# Patient Record
Sex: Male | Born: 1960 | Race: White | Hispanic: No | Smoking: Former smoker
Health system: Southern US, Community
[De-identification: ages and names within clinical notes are randomized; demographics above are authoritative.]

## PROBLEM LIST (undated history)

## (undated) DIAGNOSIS — K219 Gastro-esophageal reflux disease without esophagitis: Secondary | ICD-10-CM

## (undated) DIAGNOSIS — M1990 Unspecified osteoarthritis, unspecified site: Secondary | ICD-10-CM

## (undated) DIAGNOSIS — G4730 Sleep apnea, unspecified: Secondary | ICD-10-CM

## (undated) DIAGNOSIS — Z87438 Personal history of other diseases of male genital organs: Secondary | ICD-10-CM

## (undated) DIAGNOSIS — I10 Essential (primary) hypertension: Secondary | ICD-10-CM

## (undated) DIAGNOSIS — E785 Hyperlipidemia, unspecified: Secondary | ICD-10-CM

## (undated) HISTORY — DX: Personal history of other diseases of male genital organs: Z87.438

## (undated) HISTORY — DX: Gastro-esophageal reflux disease without esophagitis: K21.9

## (undated) HISTORY — DX: Sleep apnea, unspecified: G47.30

## (undated) HISTORY — DX: Hyperlipidemia, unspecified: E78.5

## (undated) HISTORY — DX: Essential (primary) hypertension: I10

## (undated) HISTORY — PX: EYE SURGERY: SHX253

## (undated) HISTORY — PX: HERNIA REPAIR: SHX51

## (undated) HISTORY — DX: Unspecified osteoarthritis, unspecified site: M19.90

---

## 2005-02-07 ENCOUNTER — Encounter: Admission: RE | Admit: 2005-02-07 | Discharge: 2005-02-07 | Payer: Self-pay | Admitting: Orthopedic Surgery

## 2007-01-08 ENCOUNTER — Ambulatory Visit: Payer: Self-pay | Admitting: Gastroenterology

## 2007-01-08 LAB — CONVERTED CEMR LAB
Basophils Absolute: 0.1 10*3/uL (ref 0.0–0.1)
Basophils Relative: 1 % (ref 0.0–1.0)
Eosinophils Absolute: 0.2 10*3/uL (ref 0.0–0.6)
Eosinophils Relative: 2.1 % (ref 0.0–5.0)
Ferritin: 126.4 ng/mL (ref 22.0–322.0)
Folate: 6.5 ng/mL
HCT: 40.5 % (ref 39.0–52.0)
Hemoglobin: 14.3 g/dL (ref 13.0–17.0)
Iron: 151 ug/dL (ref 42–165)
Lymphocytes Relative: 31.7 % (ref 12.0–46.0)
MCHC: 35.2 g/dL (ref 30.0–36.0)
MCV: 90.7 fL (ref 78.0–100.0)
Monocytes Absolute: 0.6 10*3/uL (ref 0.2–0.7)
Monocytes Relative: 8.4 % (ref 3.0–11.0)
Neutro Abs: 4.2 10*3/uL (ref 1.4–7.7)
Neutrophils Relative %: 56.8 % (ref 43.0–77.0)
Platelets: 303 10*3/uL (ref 150–400)
RBC: 4.47 M/uL (ref 4.22–5.81)
RDW: 11.2 % — ABNORMAL LOW (ref 11.5–14.6)
Saturation Ratios: 51.8 % — ABNORMAL HIGH (ref 20.0–50.0)
Transferrin: 208.4 mg/dL — ABNORMAL LOW (ref >=212.0)
Vitamin B-12: 195 pg/mL — ABNORMAL LOW (ref 211–911)
WBC: 7.5 10*3/uL (ref 4.5–10.5)

## 2007-01-18 ENCOUNTER — Ambulatory Visit: Payer: Self-pay | Admitting: Gastroenterology

## 2007-01-18 LAB — CONVERTED CEMR LAB
ALT: 17 units/L (ref 0–53)
AST: 17 units/L (ref 0–37)
Albumin: 3.5 g/dL (ref 3.5–5.2)
Alkaline Phosphatase: 63 units/L (ref 39–117)
Bilirubin, Direct: 0.1 mg/dL (ref 0.0–0.3)
Total Bilirubin: 0.9 mg/dL (ref 0.3–1.2)
Total Protein: 6.1 g/dL (ref 6.0–8.3)

## 2007-01-27 ENCOUNTER — Ambulatory Visit: Payer: Self-pay | Admitting: Gastroenterology

## 2007-02-03 ENCOUNTER — Ambulatory Visit: Payer: Self-pay | Admitting: Gastroenterology

## 2007-06-18 DIAGNOSIS — K921 Melena: Secondary | ICD-10-CM

## 2007-06-18 DIAGNOSIS — Z87891 Personal history of nicotine dependence: Secondary | ICD-10-CM

## 2007-06-18 DIAGNOSIS — K219 Gastro-esophageal reflux disease without esophagitis: Secondary | ICD-10-CM

## 2007-06-18 DIAGNOSIS — F1021 Alcohol dependence, in remission: Secondary | ICD-10-CM

## 2007-09-02 ENCOUNTER — Ambulatory Visit (HOSPITAL_COMMUNITY): Admission: RE | Admit: 2007-09-02 | Discharge: 2007-09-02 | Payer: Self-pay | Admitting: Surgery

## 2008-01-18 ENCOUNTER — Ambulatory Visit: Payer: Self-pay | Admitting: Gastroenterology

## 2008-01-18 DIAGNOSIS — D649 Anemia, unspecified: Secondary | ICD-10-CM

## 2008-01-18 DIAGNOSIS — E538 Deficiency of other specified B group vitamins: Secondary | ICD-10-CM

## 2008-01-18 LAB — CONVERTED CEMR LAB: Ferritin: 131.1 ng/mL (ref 22.0–322.0)

## 2008-02-07 ENCOUNTER — Telehealth: Payer: Self-pay | Admitting: Gastroenterology

## 2008-02-09 ENCOUNTER — Ambulatory Visit: Payer: Self-pay | Admitting: Gastroenterology

## 2008-02-09 DIAGNOSIS — K2980 Duodenitis without bleeding: Secondary | ICD-10-CM | POA: Insufficient documentation

## 2008-02-09 HISTORY — PX: COLONOSCOPY: SHX174

## 2008-02-09 LAB — CONVERTED CEMR LAB: UREASE: NEGATIVE

## 2008-02-11 ENCOUNTER — Encounter (INDEPENDENT_AMBULATORY_CARE_PROVIDER_SITE_OTHER): Payer: Self-pay | Admitting: *Deleted

## 2008-02-11 ENCOUNTER — Telehealth: Payer: Self-pay | Admitting: Gastroenterology

## 2008-08-08 ENCOUNTER — Encounter: Admission: RE | Admit: 2008-08-08 | Discharge: 2008-09-19 | Payer: Self-pay | Admitting: Family Medicine

## 2009-11-20 ENCOUNTER — Encounter: Admission: RE | Admit: 2009-11-20 | Discharge: 2009-11-20 | Payer: Self-pay | Admitting: Family Medicine

## 2010-04-02 ENCOUNTER — Encounter
Admission: RE | Admit: 2010-04-02 | Discharge: 2010-04-02 | Payer: Self-pay | Source: Home / Self Care | Attending: Nurse Practitioner | Admitting: Nurse Practitioner

## 2010-05-11 ENCOUNTER — Encounter: Payer: Self-pay | Admitting: Orthopedic Surgery

## 2010-05-12 ENCOUNTER — Encounter: Payer: Self-pay | Admitting: Family Medicine

## 2010-09-03 NOTE — Op Note (Signed)
Alec Gutierrez, Alec Gutierrez                ACCOUNT NO.:  [id number]   MEDICAL RECORD NO.:  [id number]          PATIENT TYPE:  AMB   LOCATION:  DAY                          FACILITY:  [facility]   PHYSICIAN:  Ardeth Sportsman, MD     DATE OF BIRTH:  [birth year]   DATE OF PROCEDURE:  DATE OF DISCHARGE:                               OPERATIVE REPORT   PRIMARY CARE PHYSICIAN:  Dr. Lindaann Pascal.   UROLOGIST:  Dr. Bjorn Pippin.   SURGEON:  Ardeth Sportsman, MD   PREOPERATIVE DIAGNOSES:  1. Left inguinal hernia.  2. Umbilical hernia.   POSTOPERATIVE DIAGNOSES:  1. Left inguinal hernia.  2. Umbilical hernia.   PROCEDURE:  1. Laparoscopic left inguinal hernia repair.  2. Umbilical hernia underlay and repair with mesh.   ANESTHESIA:  1. General anesthesia.  2. Local anesthetic in a field block around the port sites.  3. Left ilioinguinal/genitofemoral/cord nerve blocks.   SPECIMEN:  None.   DRAINS:  None.   ESTIMATED BLOOD LOSS:  Less than 50 mL.   COMPLICATIONS:  None apparent.   INDICATIONS:  Alec Gutierrez is a 50 year old male who was found to have an  incidental left inguinal hernia that became increasingly symptomatic.  He also has an umbilical hernia.  Based on increasing discomfort with  this, pathophysiology of inguinal herniations with its risks of  incarceration, strangulation and developing worsening pain was  discussed.  Options discussed and recommendations made for laparoscopic  preperitoneal exploration with left inguinal hernia repair.  There was  no evidence of right hernia again on exam today and so I did not  recommend evaluation there.  I also recommended umbilical hernia repair  with mesh.  The risks, benefits and alternatives were  discussed in  detail, questions answered and he agreed to proceed.   OPERATIVE FINDINGS:  He had a left indirect and direct inguinal hernia  and (pantaloon type).  He had a 12 mm umbilical hernia through the  stalk.   DESCRIPTION OF PROCEDURE:   Informed consent was confirmed.  The patient  voided just prior to going to the operating room.  He has been on some  oxybutynin chronically.  He underwent general anesthesia without any  difficulty.  He had sequential compression devices active during the  entire case.  He received IV antibiotics just prior to surgery.  He was  positioned supine, both arms tucked.  His abdomen clipped, prepped and  draped in a sterile fashion.   Entry into the preperitoneal space was done through an infraumbilical  curvilinear incision.  A nick was made just right of the rectus  abdominis infraumbilically and a 10 mm Hasson port was passed posterior  to the right rectus abdominal muscle.  Capnopreperitoneum to 15 mmHg  provided good anterior abdominal wall insufflation.  Camera dissection  was used to help free the peritoneum off the anterior abdominal wall and  bilateral lower quadrants.  Enough working space was created such that a  5-mm port could be placed in the left mid abdomen and the right mid  abdomen.  In doing dissection,  there was a tear in the peritoneum in the  left mid abdomen and this was controlled using a 3-0 Vicryl figure-of-  eight stitch using laparoscopic intracorporeal suturing.   The peritoneum could be seen crawling up with the cord structures in the  left lower abdomen.  The hernia sac was peeled off the cord structures  and peeled back as proximally as possible.  He had a moderate cord  lipoma.  In freeing this off, there was some oozing of blood and this is  where most of his bleeding occurred.  Pressure was held and irrigation  was done and hemostasis was good body of the end of the case.  A window  was made between his bladder and left anterior pelvic sidewall to get  down to the level of the obturator foramen.  The peritoneum was peeled  off out laterally as well. Dissection revealed direct and indirect  inguinal hernias and these were brought back.   A 15 x 15 cm ultra  light weight polypropylene (Ultrapro) mesh was cut to  a half-skull shape.  It was positioned such that the medial and inferior  flap rested down on the true pelvis, around the obturator foramen  between the lateral bladder and the anterolateral pelvic wall.  The  midline bladder raphe structures and right side of the bladder were held  intact.  The mesh laid well posterior, laterally, superiorly and  medially such that there was a least 3 inches of circumferential  coverage around both hernia defects.  The hernia sac lead points were  grasped and elevated cephalad as the capnopreperitoneum was evacuated.  Ports were removed.   Attention was turned to the umbilical hernia.  Dissection was done  around the umbilical stalk and it was freed off.  No obvious defect was  noted.  I tried to do a gentle finger sweep dissection around the  preperitoneal plane but there was a breach in this.  Therefore, I did  sweep and found no adhesions intra-abdominally.  A  6.5 cm Proceed  umbilical hernia patch was used and placed in the peritoneal cavity with  the smooth nonadherent side facing the omentum and small bowel.  The  tails were pulled up and the mesh laid well flat.  It was secured in an  underlay repair using #1 Novofil horizontal mattress stitches x4.  The  excess tails were trimmed down and tucked back into the preperitoneal  plane. The hernia defect was closed primarily using #1 Novofil figure-of-  eight stitches x2.  The umbilical stalk was tacked down to the fascia  using a zero Vicryl stitch.  The skin was closed using 4-0 Monocryl  stitch on all port sites and infraumbilical hernia site.  Sterile  dressings were applied.  The patient was extubated and sent to the  recovery room in stable addition.   There was no family to discuss with him and he did not want me to call  anyone for him. I had discussed postoperative care with him just prior  to surgery and instructions have been given as  well. We will see him in  about 2 weeks.      Ardeth Sportsman, MD  Electronically Signed     SCG/MEDQ  D:  09/02/2007  T:  09/02/2007  Job:  [id number]   cc:   Lindaann Pascal, MD   Excell Seltzer. Annabell Howells, M.D.  Fax: [phone]

## 2010-09-03 NOTE — Assessment & Plan Note (Signed)
[facility]                         GASTROENTEROLOGY OFFICE NOTE   Alec Gutierrez, Alec Gutierrez                       MRN:          [id number]  DATE:01/08/2007                            DOB:          1960-12-26    Alec Gutierrez is a 50 year old white male referred by Lindaann Pascal, PA for  evaluation of guaiac positive stools and intermittent rectal bleeding  over the last year.   HISTORY OF PRESENT ILLNESS:  Alec Gutierrez described regular stools without  constipation, abdominal pain or rectal pain.  He has had bright red  blood intermittently for the last year.  He recently had a guaiac  positive stool.  He also gives a history of chronic gastroesophageal  reflux disease over the last 3 years, is on maintenance Prilosec  therapy.  He had an episode of melena, apparently a year ago that lasted  one week but resolved on its own.   RECENT LABORATORY DATA:  [facility] showed a  normal metabolic profile and liver function tests and PSA and thyroid  function test.  Chest x-ray was normal.  I do not see CBC results.   Alec Gutierrez denies abuse of NSAIDs or aspirin products.   His only medication is Prilosec.   He has never had previous x-rays or endoscopic procedures.  His appetite  is good and his weight is stable.  He denies any specific food  intolerances.   PAST MEDICAL HISTORY:  Otherwise noncontributory.   FAMILY HISTORY:  Noncontributory.   SOCIAL HISTORY:  The patient is divorced and lives by himself.  He has a  12th grade education and works in [employer].  He smokes a 1/2 pack  of cigarettes per day and drinks 6-8 beers a day.  He does feel like he  has a problem with alcohol abuse and has had previous DWIs.  He has  contemplated discontinuing alcohol use in the past.  He has never had  known pancreatitis or hepatitis.   REVIEW OF SYSTEMS:  Otherwise noncontributory without any current  cardiovascular, pulmonary, neurologic, or  genitourinary problems.   PHYSICAL EXAMINATION:  GENERAL:  He is a healthy-appearing white male  appearing his stated age in no acute distress.  VITAL SIGNS:  He is 5 feet, 10 inches tall and weighs 160 pounds.  Blood  pressure 114/54 and pulse was 100 and regular.  I could not appreciate  stigmata of chronic liver disease or thyromegaly.  CHEST:  Entirely clear to percussion/auscultation.  HEART:  Regular rhythm without murmurs, gallops, or rubs.  There was no  hepatosplenomegaly, abdominal masses, or tenderness.  ABDOMEN:  Bowel sounds were normal.  EXTREMITIES:  Unremarkable.  MENTAL STATUS:  Clear.  RECTAL:  Inspection of his rectum did show a small posterior external  hemorrhoid but no fissures of fistulae.  Rectal exam was deferred.   ASSESSMENT:  1. Intermittent rectal bleeding - rule out colonic polyposis.  2. Chronic gastroesophageal reflux disease - rule out Barrett's      mucosa.  3. History of chronic ethanol abuse without known liver or pancreatic  disease.  4. History of chronic cigarette abuse with normal chest x-ray.   RECOMMENDATIONS:  1. Check CBC and iron studies.  2. Outpatient endoscopy and colonoscopy at his convenience.  3. Continue other medications per Dr. Jacqulyn Bath.     Vania Rea. Jarold Motto, MD, Caleen Essex, FAGA  Electronically Signed    DRP/MedQ  DD: 01/08/2007  DT: 01/08/2007  Job #: [id number]   cc:   Lindaann Pascal, PA

## 2010-10-22 ENCOUNTER — Ambulatory Visit
Admission: RE | Admit: 2010-10-22 | Discharge: 2010-10-22 | Disposition: A | Discharge status: Home / Self Care | Payer: 59 | Source: Ambulatory Visit | Attending: Family Medicine | Admitting: Family Medicine

## 2010-10-22 ENCOUNTER — Other Ambulatory Visit: Payer: Self-pay | Admitting: Family Medicine

## 2010-10-22 DIAGNOSIS — M7989 Other specified soft tissue disorders: Secondary | ICD-10-CM

## 2012-02-14 LAB — CBC AND DIFFERENTIAL
HCT: 15 % — AB (ref 41–53)
Platelets: 285 10*3/uL (ref 150–399)
WBC: 9 10^3/mL

## 2012-02-14 LAB — HEPATIC FUNCTION PANEL
ALT: 53 U/L — AB (ref 10–40)
AST: 34 U/L (ref 14–40)
Alkaline Phosphatase: 69 U/L (ref 25–125)
Bilirubin, Total: 0.5 mg/dL

## 2012-02-14 LAB — LIPID PANEL
Cholesterol: 263 mg/dL — AB (ref 0–200)
HDL: 57 mg/dL (ref 35–70)
LDL Cholesterol: 188 mg/dL

## 2012-02-14 LAB — BASIC METABOLIC PANEL
BUN: 14 mg/dL (ref 4–21)
Creatinine: 0.9 mg/dL (ref 0.6–1.3)
Glucose: 89 mg/dL
Sodium: 140 mmol/L (ref 137–147)

## 2012-02-14 LAB — TSH: TSH: 2.96 u[IU]/mL (ref 0.41–5.90)

## 2012-07-06 ENCOUNTER — Ambulatory Visit: Payer: Self-pay | Admitting: General Practice

## 2012-08-18 ENCOUNTER — Telehealth: Payer: Self-pay | Admitting: Nurse Practitioner

## 2012-08-18 NOTE — Telephone Encounter (Signed)
appt made

## 2012-08-19 ENCOUNTER — Ambulatory Visit (INDEPENDENT_AMBULATORY_CARE_PROVIDER_SITE_OTHER): Payer: Managed Care, Other (non HMO) | Admitting: General Practice

## 2012-08-19 ENCOUNTER — Encounter: Payer: Self-pay | Admitting: General Practice

## 2012-08-19 VITALS — BP 132/72 | HR 94 | Temp 98.4°F | Ht 69.0 in | Wt 192.0 lb

## 2012-08-19 DIAGNOSIS — R220 Localized swelling, mass and lump, head: Secondary | ICD-10-CM

## 2012-08-19 DIAGNOSIS — R221 Localized swelling, mass and lump, neck: Secondary | ICD-10-CM

## 2012-08-19 DIAGNOSIS — J322 Chronic ethmoidal sinusitis: Secondary | ICD-10-CM

## 2012-08-19 MED ORDER — AMOXICILLIN 500 MG PO CAPS: 500.0000 mg | ORAL_CAPSULE | Freq: Two times a day (BID) | ORAL | Status: DC

## 2012-08-19 NOTE — Progress Notes (Addendum)
  Subjective:    Patient ID: Alec Gutierrez, male    DOB: [birth year], 52 y.o.   MRN: [id number]  HPI Presents today with sinus pressure (forehead area and behind eyes) and headache. Reports having a sinus infection a three months ago and feels like it never resolved. OTC sinus and congestion medications taken with minimal relief. Reports having lump just below right ear and neck area.     Review of Systems  Constitutional: Negative for chills.  HENT: Negative for congestion, sneezing and postnasal drip.   Respiratory: Negative for chest tightness and shortness of breath.   Cardiovascular: Negative for chest pain.  Genitourinary: Negative for difficulty urinating.  Musculoskeletal: Negative for myalgias.  Skin: Negative.   Psychiatric/Behavioral: Negative.        Objective:   Physical Exam  Constitutional: He is oriented to person, place, and time. He appears well-developed and well-nourished.  HENT:  Right Ear: External ear normal.  Left Ear: External ear normal.  Nose: Right sinus exhibits frontal sinus tenderness. Right sinus exhibits no maxillary sinus tenderness. Left sinus exhibits frontal sinus tenderness. Left sinus exhibits no maxillary sinus tenderness.  Mouth/Throat: Oropharynx is clear and moist.  Firm 1 inch x 1/2 inch mass noted to right upper neck.   Cardiovascular: Normal rate, regular rhythm and normal heart sounds.   No murmur heard. Pulmonary/Chest: Effort normal and breath sounds normal. No respiratory distress. He exhibits no tenderness.  Neurological: He is alert and oriented to person, place, and time.  Skin: Skin is warm and dry. No rash noted. No erythema.  Psychiatric: He has a normal mood and affect.          Assessment & Plan:  Take antibiotics as prescribed, complete course even if feeling better Ultrasound of right neck to be scheduled RTO if symptoms worsen Patient verbalized understanding Coralie Keens, FNP-C

## 2012-08-19 NOTE — Progress Notes (Signed)
Appt scheduled for US of the neck at [facility] on 08/23/13.  Patient aware.

## 2012-08-19 NOTE — Patient Instructions (Signed)

## 2012-08-23 ENCOUNTER — Ambulatory Visit (HOSPITAL_COMMUNITY)
Admission: RE | Admit: 2012-08-23 | Discharge: 2012-08-23 | Disposition: A | Discharge status: Home / Self Care | Payer: Managed Care, Other (non HMO) | Source: Ambulatory Visit | Attending: General Practice | Admitting: General Practice

## 2012-08-23 DIAGNOSIS — R221 Localized swelling, mass and lump, neck: Secondary | ICD-10-CM | POA: Insufficient documentation

## 2012-08-23 DIAGNOSIS — J322 Chronic ethmoidal sinusitis: Secondary | ICD-10-CM

## 2012-08-23 DIAGNOSIS — R220 Localized swelling, mass and lump, head: Secondary | ICD-10-CM | POA: Insufficient documentation

## 2012-08-25 ENCOUNTER — Telehealth: Payer: Self-pay | Admitting: Family Medicine

## 2012-08-25 NOTE — Telephone Encounter (Signed)
Patient called and said he would like someone to call him about his procedure results, Monday/Aug 23, 2012

## 2012-08-25 NOTE — Telephone Encounter (Signed)
The ultrasound of the neck indicates this is most likely a reactive lymph node not a neoplastic node. This means that is probably responding to some infection viral or bacterial. Ask if he has any additional questions. My consider trying an antibiotic for 10 days and then following up on checking the lymph node. If it doesn't go away we may have to consider biopsy.

## 2012-08-25 NOTE — Telephone Encounter (Signed)
Please look at his ultrasound he had done Monday.

## 2012-08-25 NOTE — Telephone Encounter (Signed)
Mae, I think you ordered this on this pt. Please review

## 2012-08-26 ENCOUNTER — Telehealth: Payer: Self-pay | Admitting: General Practice

## 2012-08-26 NOTE — Telephone Encounter (Signed)
Discussed ultrasound results with patient, he continues to take antibiotics, and will schedule appointment in 2 weeks for recheck. Patient verbalized understanding.

## 2013-04-20 ENCOUNTER — Encounter: Payer: Self-pay | Admitting: Family Medicine

## 2013-04-20 ENCOUNTER — Ambulatory Visit (INDEPENDENT_AMBULATORY_CARE_PROVIDER_SITE_OTHER): Payer: Managed Care, Other (non HMO) | Admitting: Family Medicine

## 2013-04-20 VITALS — BP 125/77 | HR 89 | Temp 98.2°F | Ht 68.0 in | Wt 189.0 lb

## 2013-04-20 DIAGNOSIS — Z23 Encounter for immunization: Secondary | ICD-10-CM

## 2013-04-20 DIAGNOSIS — M129 Arthropathy, unspecified: Secondary | ICD-10-CM

## 2013-04-20 DIAGNOSIS — M1990 Unspecified osteoarthritis, unspecified site: Secondary | ICD-10-CM

## 2013-04-20 DIAGNOSIS — L818 Other specified disorders of pigmentation: Secondary | ICD-10-CM

## 2013-04-20 DIAGNOSIS — L819 Disorder of pigmentation, unspecified: Secondary | ICD-10-CM

## 2013-04-20 MED ORDER — MELOXICAM 15 MG PO TABS: 15.0000 mg | ORAL_TABLET | Freq: Every day | ORAL | Status: DC

## 2013-04-20 NOTE — Progress Notes (Signed)
   Subjective:    Patient ID: Alec Gutierrez, male    DOB: 08-Aug-1960, 52 y.o.   MRN: [id number]  HPI  This 52 y.o. male presents for evaluation of arthritis and back pain.  He was taking meloxicam and quit Because he was told by his insurance company that he shouldn't take it.  He also quit taking pravachol Because he states he was told not to take this by someone from [employer] company who called him.  He has a tattoo on his left shoulder he wants removed and wants a referral to see a dermatologist. He is due for CPE and CPE labs. He request flu shot.  Review of Systems No chest pain, SOB, HA, dizziness, vision change, N/V, diarrhea, constipation, dysuria, urinary urgency or frequency, myalgias, arthralgias or rash.     Objective:   Physical Exam  Vital signs noted  Well developed well nourished male.  HEENT - Head atraumatic Normocephalic                Eyes - PERRLA, Conjuctiva - clear Sclera- Clear EOMI                Ears - EAC's Wnl TM's Wnl Gross Hearing WNL                Nose - Nares patent                 Throat - oropharanx wnl Respiratory - Lungs CTA bilateral Cardiac - RRR S1 and S2 without murmur GI - Abdomen soft Nontender and bowel sounds active x 4 Extremities - No edema. Neuro - Grossly intact.      Assessment & Plan:  Arthritis - Plan: meloxicam (MOBIC) 15 MG tablet  Tattoo of skin - Plan: Ambulatory referral to Dermatology  Need for prophylactic vaccination and inoculation against influenza  Discussed getting a CPE and willl follow up in next year.    Deatra Canter FNP

## 2013-04-20 NOTE — Patient Instructions (Signed)

## 2013-04-22 ENCOUNTER — Telehealth: Payer: Self-pay | Admitting: Family Medicine

## 2013-04-25 ENCOUNTER — Telehealth: Payer: Self-pay | Admitting: Family Medicine

## 2013-04-26 ENCOUNTER — Other Ambulatory Visit: Payer: Self-pay

## 2013-04-26 DIAGNOSIS — M1990 Unspecified osteoarthritis, unspecified site: Secondary | ICD-10-CM

## 2013-04-26 MED ORDER — MELOXICAM 15 MG PO TABS
15.0000 mg | ORAL_TABLET | Freq: Every day | ORAL | Status: DC
Start: 2013-04-26 — End: 2013-04-28

## 2013-04-26 NOTE — Telephone Encounter (Signed)
Order for mobic was sent to pharmacy today.

## 2013-04-28 ENCOUNTER — Other Ambulatory Visit: Payer: Self-pay | Admitting: Family Medicine

## 2013-04-28 DIAGNOSIS — M1990 Unspecified osteoarthritis, unspecified site: Secondary | ICD-10-CM

## 2013-04-28 MED ORDER — MELOXICAM 15 MG PO TABS: 15.0000 mg | ORAL_TABLET | Freq: Every day | ORAL | Status: DC

## 2013-05-25 ENCOUNTER — Encounter: Payer: Self-pay | Admitting: Nurse Practitioner

## 2013-05-25 ENCOUNTER — Ambulatory Visit (INDEPENDENT_AMBULATORY_CARE_PROVIDER_SITE_OTHER): Payer: Managed Care, Other (non HMO) | Admitting: Nurse Practitioner

## 2013-05-25 VITALS — BP 148/94 | HR 108 | Temp 101.0°F | Ht 68.0 in | Wt 193.0 lb

## 2013-05-25 DIAGNOSIS — J029 Acute pharyngitis, unspecified: Secondary | ICD-10-CM

## 2013-05-25 DIAGNOSIS — R05 Cough: Secondary | ICD-10-CM

## 2013-05-25 DIAGNOSIS — R059 Cough, unspecified: Secondary | ICD-10-CM

## 2013-05-25 DIAGNOSIS — R509 Fever, unspecified: Secondary | ICD-10-CM

## 2013-05-25 DIAGNOSIS — J111 Influenza due to unidentified influenza virus with other respiratory manifestations: Secondary | ICD-10-CM

## 2013-05-25 LAB — POCT RAPID STREP A (OFFICE): Rapid Strep A Screen: NEGATIVE

## 2013-05-25 LAB — POCT INFLUENZA A/B
Influenza A, POC: NEGATIVE
Influenza B, POC: NEGATIVE

## 2013-05-25 MED ORDER — OSELTAMIVIR PHOSPHATE 75 MG PO CAPS: 75.0000 mg | ORAL_CAPSULE | Freq: Two times a day (BID) | ORAL | Status: DC

## 2013-05-25 NOTE — Patient Instructions (Signed)

## 2013-05-25 NOTE — Progress Notes (Signed)
   Subjective:    Patient ID: Alec Gutierrez, male    DOB: [birth year], 53 y.o.   MRN: [id number]  HPI Patient in today with c/o cough, congestion,sorethroat and nausea.- Started 3 days ago.    Review of Systems  Constitutional: Positive for fever, chills and diaphoresis.  HENT: Positive for congestion, rhinorrhea and sore throat. Negative for trouble swallowing and voice change.   Respiratory: Positive for cough.   Gastrointestinal: Positive for nausea and vomiting (X2 last night).  Neurological: Positive for headaches.       Objective:   Physical Exam  Constitutional: He appears well-developed and well-nourished. He appears distressed.  HENT:  Right Ear: Hearing, tympanic membrane, external ear and ear canal normal.  Left Ear: Hearing, tympanic membrane, external ear and ear canal normal.  Nose: Mucosal edema and rhinorrhea present. Right sinus exhibits no maxillary sinus tenderness and no frontal sinus tenderness. Left sinus exhibits no maxillary sinus tenderness and no frontal sinus tenderness.  Mouth/Throat: Uvula is midline, oropharynx is clear and moist and mucous membranes are normal.  Eyes: Conjunctivae are normal. Pupils are equal, round, and reactive to light.  Neck: Normal range of motion. Neck supple.  Cardiovascular: Normal rate, regular rhythm and normal heart sounds.   Pulmonary/Chest: Effort normal and breath sounds normal. No respiratory distress. He has no wheezes. He has no rales.  Dry cough  Psychiatric: He has a normal mood and affect. His behavior is normal. Judgment and thought content normal.     BP 148/94  Pulse 108  Temp(Src) 101 F (38.3 C) (Oral)  Ht 5\' 8"  (1.727 m)  Wt 193 lb (87.544 kg)  BMI 29.35 kg/m2 Results for orders placed in visit on 05/25/13  POCT INFLUENZA A/B      Result Value Range   Influenza A, POC Negative     Influenza B, POC Negative    POCT RAPID STREP A (OFFICE)      Result Value Range   Rapid Strep A Screen Negative   Negative        Assessment & Plan:   1. Fever   2. Cough   3. Sore throat   4. Influenza    Meds ordered this encounter  Medications  . oseltamivir (TAMIFLU) 75 MG capsule    Sig: Take 1 capsule (75 mg total) by mouth 2 (two) times daily.    Dispense:  10 capsule    Refill:  0    Order Specific Question:  Supervising Provider    Answer:  Chipper Herb [[id number]]    1. Take meds as prescribed 2. Use a cool mist humidifier especially during the winter months and when heat has been humid. 3. Use saline nose sprays frequently 4. Saline irrigations of the nose can be very helpful if done frequently.  * 4X daily for 1 week*  * Use of a nettie pot can be helpful with this. Follow directions with this* 5. Drink plenty of fluids 6. Keep thermostat turn down low 7.For any cough or congestion  Use plain Mucinex- regular strength or max strength is fine   * Children- consult with Pharmacist for dosing 8. For fever or aces or pains- take tylenol or ibuprofen appropriate for age and weight.  * for fevers greater than 101 orally you may alternate ibuprofen and tylenol every  3 hours.   Mary-Margaret Hassell Done, FNP

## 2013-08-11 ENCOUNTER — Ambulatory Visit (INDEPENDENT_AMBULATORY_CARE_PROVIDER_SITE_OTHER): Payer: Managed Care, Other (non HMO) | Admitting: Family Medicine

## 2013-08-11 VITALS — BP 139/88 | HR 96 | Temp 98.3°F | Ht 68.0 in | Wt 191.8 lb

## 2013-08-11 DIAGNOSIS — J069 Acute upper respiratory infection, unspecified: Secondary | ICD-10-CM

## 2013-08-11 MED ORDER — AZITHROMYCIN 250 MG PO TABS: ORAL_TABLET | ORAL | Status: DC

## 2013-08-11 MED ORDER — METHYLPREDNISOLONE ACETATE 80 MG/ML IJ SUSP
80.0000 mg | Freq: Once | INTRAMUSCULAR | Status: AC
  Administered 2013-08-11: 80 mg via INTRAMUSCULAR

## 2013-08-11 NOTE — Progress Notes (Signed)
   Subjective:    Patient ID: Laurann Montana, male    DOB: Jul 02, 1960, 52 y.o.   MRN: [id number]  HPI This 53 y.o. male presents for evaluation of uri sx's.   Review of Systems No chest pain, SOB, HA, dizziness, vision change, N/V, diarrhea, constipation, dysuria, urinary urgency or frequency, myalgias, arthralgias or rash.     Objective:   Physical Exam Vital signs noted  Well developed well nourished male.  HEENT - Head atraumatic Normocephalic                Eyes - PERRLA, Conjuctiva - clear Sclera- Clear EOMI                Ears - EAC's Wnl TM's Wnl Gross Hearing WNL                Nose - Nares patent                 Throat - oropharanx wnl Respiratory - Lungs CTA bilateral Cardiac - RRR S1 and S2 without murmur GI - Abdomen soft Nontender and bowel sounds active x 4 Extremities - No edema. Neuro - Grossly intact.       Assessment & Plan:  URI (upper respiratory infection) - Plan: azithromycin (ZITHROMAX) 250 MG tablet Depomedrol 80mg  IM Push po fluids, rest, tylenol and motrin otc prn as directed for fever, arthralgias, and myalgias.  Follow up prn if sx's continue or persist.  Lysbeth Penner FNP

## 2013-08-11 NOTE — Addendum Note (Signed)
Addended by: Lysbeth Penner on: 08/11/2013 10:19 AM   Modules accepted: Level of Service

## 2013-08-29 ENCOUNTER — Telehealth: Payer: Self-pay | Admitting: Family Medicine

## 2013-08-29 MED ORDER — PANTOPRAZOLE SODIUM 40 MG PO TBEC: 40.0000 mg | DELAYED_RELEASE_TABLET | Freq: Every day | ORAL | Status: DC

## 2013-08-29 NOTE — Telephone Encounter (Signed)
protonix rx sent to pharmacy- this is the first request I have gotten for this

## 2013-08-30 ENCOUNTER — Other Ambulatory Visit: Payer: Self-pay

## 2013-08-30 ENCOUNTER — Telehealth: Payer: Self-pay | Admitting: Nurse Practitioner

## 2013-08-30 MED ORDER — PANTOPRAZOLE SODIUM 40 MG PO TBEC: 40.0000 mg | DELAYED_RELEASE_TABLET | Freq: Every day | ORAL | Status: DC

## 2013-08-30 NOTE — Telephone Encounter (Signed)
Please check with pharmacy because we got confirmation that rx was received by pharmacy

## 2013-09-01 ENCOUNTER — Telehealth: Payer: Self-pay | Admitting: Nurse Practitioner

## 2013-09-01 MED ORDER — PANTOPRAZOLE SODIUM 40 MG PO TBEC: 40.0000 mg | DELAYED_RELEASE_TABLET | Freq: Every day | ORAL | Status: DC

## 2013-09-01 NOTE — Telephone Encounter (Signed)
rx sent to pharmacy

## 2014-01-19 ENCOUNTER — Encounter: Payer: Self-pay | Admitting: Gastroenterology

## 2014-02-27 ENCOUNTER — Encounter (INDEPENDENT_AMBULATORY_CARE_PROVIDER_SITE_OTHER): Payer: Self-pay

## 2014-02-27 ENCOUNTER — Ambulatory Visit (INDEPENDENT_AMBULATORY_CARE_PROVIDER_SITE_OTHER): Payer: Managed Care, Other (non HMO) | Admitting: Family Medicine

## 2014-02-27 ENCOUNTER — Encounter: Payer: Self-pay | Admitting: Family Medicine

## 2014-02-27 VITALS — BP 131/79 | HR 92 | Temp 98.0°F | Ht 68.0 in | Wt 191.4 lb

## 2014-02-27 DIAGNOSIS — J069 Acute upper respiratory infection, unspecified: Secondary | ICD-10-CM

## 2014-02-27 MED ORDER — AZITHROMYCIN 250 MG PO TABS: ORAL_TABLET | ORAL | Status: DC

## 2014-02-27 MED ORDER — HYDROCODONE-HOMATROPINE 5-1.5 MG/5ML PO SYRP: 5.0000 mL | ORAL_SOLUTION | Freq: Three times a day (TID) | ORAL | Status: DC | PRN

## 2014-02-27 NOTE — Progress Notes (Signed)
   Subjective:    Patient ID: Alec Gutierrez, male    DOB: December 06, 1960, 53 y.o.   MRN: [id number]  HPI Patient is here for c/o cough and uri sx's.  Review of Systems  Constitutional: Negative for fever.  HENT: Negative for ear pain.   Eyes: Negative for discharge.  Respiratory: Negative for cough.   Cardiovascular: Negative for chest pain.  Gastrointestinal: Negative for abdominal distention.  Endocrine: Negative for polyuria.  Genitourinary: Negative for difficulty urinating.  Musculoskeletal: Negative for gait problem and neck pain.  Skin: Negative for color change and rash.  Neurological: Negative for speech difficulty and headaches.  Psychiatric/Behavioral: Negative for agitation.       Objective:    BP 131/79 mmHg  Pulse 92  Temp(Src) 98 F (36.7 C) (Oral)  Ht 5\' 8"  (1.727 m)  Wt 191 lb 6.4 oz (86.818 kg)  BMI 29.11 kg/m2 Physical Exam  Constitutional: He is oriented to person, place, and time. He appears well-developed and well-nourished.  HENT:  Head: Normocephalic and atraumatic.  Mouth/Throat: Oropharynx is clear and moist.  Eyes: Pupils are equal, round, and reactive to light.  Neck: Normal range of motion. Neck supple.  Cardiovascular: Normal rate and regular rhythm.   No murmur heard. Pulmonary/Chest: Effort normal and breath sounds normal.  Abdominal: Soft. Bowel sounds are normal. There is no tenderness.  Neurological: He is alert and oriented to person, place, and time.  Skin: Skin is warm and dry.  Psychiatric: He has a normal mood and affect.          Assessment & Plan:     ICD-9-CM ICD-10-CM   1. URI (upper respiratory infection) 465.9 J06.9 azithromycin (ZITHROMAX) 250 MG tablet     Return if symptoms worsen or fail to improve.  Lysbeth Penner FNP

## 2014-02-27 NOTE — Addendum Note (Signed)
Addended by: Lysbeth Penner on: 02/27/2014 12:39 PM   Modules accepted: Orders, SmartSet

## 2014-04-04 ENCOUNTER — Ambulatory Visit: Payer: Managed Care, Other (non HMO) | Admitting: Family Medicine

## 2014-04-07 ENCOUNTER — Telehealth: Payer: Self-pay | Admitting: Family Medicine

## 2014-04-07 ENCOUNTER — Ambulatory Visit: Payer: Managed Care, Other (non HMO) | Admitting: Family Medicine

## 2014-04-07 NOTE — Telephone Encounter (Signed)
Appointment given for 12/22 with MMM to check knot on left side.

## 2014-04-11 ENCOUNTER — Ambulatory Visit (INDEPENDENT_AMBULATORY_CARE_PROVIDER_SITE_OTHER): Payer: Managed Care, Other (non HMO) | Admitting: Nurse Practitioner

## 2014-04-11 ENCOUNTER — Encounter: Payer: Self-pay | Admitting: Nurse Practitioner

## 2014-04-11 VITALS — BP 136/84 | HR 80 | Temp 97.4°F | Ht 68.0 in | Wt 191.0 lb

## 2014-04-11 DIAGNOSIS — D171 Benign lipomatous neoplasm of skin and subcutaneous tissue of trunk: Secondary | ICD-10-CM

## 2014-04-11 NOTE — Patient Instructions (Signed)
Lipoma  A lipoma is a noncancerous (benign) tumor composed of fat cells. They are usually found under the skin (subcutaneous). A lipoma may occur in any tissue of the body that contains fat. Common areas for lipomas to appear include the back, shoulders, buttocks, and thighs. Lipomas are a very common soft tissue growth. They are soft and grow slowly. Most problems caused by a lipoma depend on where it is growing.  DIAGNOSIS   A lipoma can be diagnosed with a physical exam. These tumors rarely become cancerous, but radiographic studies can help determine this for certain. Studies used may include:  · Computerized X-ray scans (CT or CAT scan).  · Computerized magnetic scans (MRI).  TREATMENT   Small lipomas that are not causing problems may be watched. If a lipoma continues to enlarge or causes problems, removal is often the best treatment. Lipomas can also be removed to improve appearance. Surgery is done to remove the fatty cells and the surrounding capsule. Most often, this is done with medicine that numbs the area (local anesthetic). The removed tissue is examined under a microscope to make sure it is not cancerous. Keep all follow-up appointments with your caregiver.  SEEK MEDICAL CARE IF:   · The lipoma becomes larger or hard.  · The lipoma becomes painful, red, or increasingly swollen. These could be signs of infection or a more serious condition.  Document Released: 03/28/2002 Document Revised: 06/30/2011 Document Reviewed: 09/07/2009  [employer]® Patient Information ©2015 [employer]. This information is not intended to replace advice given to you by your health care provider. Make sure you discuss any questions you have with your health care provider.

## 2014-04-11 NOTE — Progress Notes (Signed)
   Subjective:    Patient ID: Alec Gutierrez, male    DOB: February 04, 1961, 53 y.o.   MRN: [id number]  HPI Patient in c/o knot on left side of abdomen. Has been there for a long time. He says there has been no change. He said his mom has cancer and he started getting worried.    Review of Systems  Constitutional: Negative.   HENT: Negative.   Respiratory: Negative.   Cardiovascular: Negative.   Gastrointestinal: Negative.   Genitourinary: Negative.   Neurological: Negative.   Psychiatric/Behavioral: Negative.   All other systems reviewed and are negative.      Objective:   Physical Exam  Constitutional: He is oriented to person, place, and time. He appears well-developed and well-nourished.  Cardiovascular: Normal rate, regular rhythm and normal heart sounds.   Pulmonary/Chest: Effort normal and breath sounds normal.  Abdominal: Soft. Bowel sounds are normal. He exhibits no distension and no mass. There is no tenderness. There is no rebound and no guarding.  Neurological: He is alert and oriented to person, place, and time.  Skin: Skin is warm.  Psychiatric: He has a normal mood and affect. His behavior is normal. Judgment and thought content normal.   BP 136/84 mmHg  Pulse 80  Temp(Src) 97.4 F (36.3 C) (Oral)  Ht 5\' 8"  (1.727 m)  Wt 191 lb (86.637 kg)  BMI 29.05 kg/m2        Assessment & Plan:   1. Lipoma of abdominal wall    Watch for changes in size RTO prn  Mary-Margaret Hassell Done, FNP

## 2014-05-01 ENCOUNTER — Telehealth: Payer: Self-pay | Admitting: Nurse Practitioner

## 2014-05-02 NOTE — Telephone Encounter (Signed)
Printed and gave this message to [employer]

## 2014-05-03 ENCOUNTER — Other Ambulatory Visit: Payer: Self-pay | Admitting: Family Medicine

## 2014-05-05 ENCOUNTER — Other Ambulatory Visit: Payer: Self-pay | Admitting: Family Medicine

## 2014-05-09 NOTE — Telephone Encounter (Signed)
Alec Gutierrez's ins plan [name] is not covering any medication for GERD , not dexilant, nexium or anything the lady ran.  He told me that they told him nothing would be covered but I didn't believe him so I checked on it myself and apparently he is right. Just wantedt o let you know.

## 2014-05-09 NOTE — Telephone Encounter (Signed)
If insurance will ot cover anything then will have to do prilosec OTC

## 2014-05-10 NOTE — Telephone Encounter (Signed)
Patient notified

## 2014-07-12 ENCOUNTER — Encounter: Payer: Self-pay | Admitting: Family Medicine

## 2014-07-12 ENCOUNTER — Ambulatory Visit (INDEPENDENT_AMBULATORY_CARE_PROVIDER_SITE_OTHER): Payer: Managed Care, Other (non HMO) | Admitting: Family Medicine

## 2014-07-12 VITALS — BP 139/90 | HR 86 | Temp 97.7°F | Ht 68.0 in | Wt 193.0 lb

## 2014-07-12 DIAGNOSIS — R030 Elevated blood-pressure reading, without diagnosis of hypertension: Secondary | ICD-10-CM | POA: Diagnosis not present

## 2014-07-12 DIAGNOSIS — Z0000 Encounter for general adult medical examination without abnormal findings: Secondary | ICD-10-CM

## 2014-07-12 DIAGNOSIS — Z114 Encounter for screening for human immunodeficiency virus [HIV]: Secondary | ICD-10-CM

## 2014-07-12 DIAGNOSIS — IMO0001 Reserved for inherently not codable concepts without codable children: Secondary | ICD-10-CM

## 2014-07-12 LAB — POCT CBC
GRANULOCYTE PERCENT: 62.5 % (ref 37–80)
HCT, POC: 46.3 % (ref 43.5–53.7)
Hemoglobin: 15 g/dL (ref 14.1–18.1)
Lymph, poc: 3 (ref 0.6–3.4)
MCH, POC: 29.8 pg (ref 27–31.2)
MCHC: 32.4 g/dL (ref 31.8–35.4)
MCV: 92 fL (ref 80–97)
MPV: 6.7 fL (ref 0–99.8)
PLATELET COUNT, POC: 268 10*3/uL (ref 142–424)
POC Granulocyte: 5.8 (ref 2–6.9)
POC LYMPH %: 32.6 % (ref 10–50)
RBC: 5.03 M/uL (ref 4.69–6.13)
RDW, POC: 13.1 %
WBC: 9.2 10*3/uL (ref 4.6–10.2)

## 2014-07-12 NOTE — Progress Notes (Signed)
   Subjective:    Patient ID: Alec Gutierrez, male    DOB: 01/06/1961, 54 y.o.   MRN: [id number]  HPI  54 year old gentleman here for physical. He has been monitoring his blood pressure at work and the machine tells him that he is at risk for hypertension. He does not have any symptoms such as headache, chest pain, or dizziness. There is a history of snoring and possible sleep apnea but this has not been verified. There is no family history of hypertension that he is aware.  Patient Active Problem List   Diagnosis Date Noted  . DUODENITIS, WITHOUT HEMORRHAGE 02/09/2008  . B12 DEFICIENCY 01/18/2008  . ANEMIA 01/18/2008  . GERD 06/18/2007  . GUAIAC POSITIVE STOOL 06/18/2007  . ALCOHOL ABUSE, HX OF 06/18/2007  . TOBACCO ABUSE, HX OF 06/18/2007   Outpatient Encounter Prescriptions as of 07/12/2014  Medication Sig  . meloxicam (MOBIC) 15 MG tablet TAKE 1 TABLET (15 MG TOTAL) BY MOUTH DAILY.  . pantoprazole (PROTONIX) 40 MG tablet Take 1 tablet (40 mg total) by mouth daily.      Review of Systems  Constitutional: Negative.   Respiratory: Negative.   Cardiovascular: Negative.   Gastrointestinal: Positive for blood in stool.  Neurological: Negative.   Psychiatric/Behavioral: Negative.        Objective:   Physical Exam  Constitutional: He is oriented to person, place, and time. He appears well-developed and well-nourished.  HENT:  Head: Normocephalic.  Right Ear: External ear normal.  Left Ear: External ear normal.  Nose: Nose normal.  Mouth/Throat: Oropharynx is clear and moist.  Eyes: Conjunctivae and EOM are normal. Pupils are equal, round, and reactive to light.  Neck: Normal range of motion. Neck supple.  Cardiovascular: Normal rate, regular rhythm, normal heart sounds and intact distal pulses.   Pulmonary/Chest: Effort normal and breath sounds normal.  Abdominal: Soft. Bowel sounds are normal.  Musculoskeletal: Normal range of motion.  Neurological: He is alert and oriented  to person, place, and time.  Skin: Skin is warm and dry.  Psychiatric: He has a normal mood and affect. His behavior is normal. Judgment and thought content normal.    BP 139/90 mmHg  Pulse 86  Temp(Src) 97.7 F (36.5 C) (Oral)  Ht _0  (1.727 m)  Wt 193 lb (87.544 kg)  BMI 29.35 kg/m2      Assessment & Plan:  1. Screening for HIV (human immunodeficiency virus)  - HIV antibody (with reflex)  2. Health care maintenance  - TSH - CMP14+EGFR - Lipid panel - POCT CBC  3. Blood pressure elevated Patient to institute lifestyle changes. He has a Engineer, maintenance at work. He has already tried to reduce sodium intake but admits to liking beer. Also eats canned soup with high sodium content. I have asked him to come back in 3 months. If pressure is still generally above 135/85 would institute treatment.  Wardell Honour MD

## 2014-07-12 NOTE — Patient Instructions (Signed)

## 2014-07-13 LAB — CMP14+EGFR
ALT: 36 IU/L (ref 0–44)
AST: 20 IU/L (ref 0–40)
Albumin/Globulin Ratio: 2 (ref 1.1–2.5)
Albumin: 4.3 g/dL (ref 3.5–5.5)
Alkaline Phosphatase: 61 IU/L (ref 39–117)
BUN / CREAT RATIO: 21 — AB (ref 9–20)
BUN: 19 mg/dL (ref 6–24)
Bilirubin Total: 0.5 mg/dL (ref 0.0–1.2)
CALCIUM: 9.4 mg/dL (ref 8.7–10.2)
CO2: 22 mmol/L (ref 18–29)
Chloride: 102 mmol/L (ref 97–108)
Creatinine, Ser: 0.92 mg/dL (ref 0.76–1.27)
GFR calc Af Amer: 109 mL/min/{1.73_m2} (ref >59)
GFR calc non Af Amer: 94 mL/min/{1.73_m2} (ref >59)
Globulin, Total: 2.2 g/dL (ref 1.5–4.5)
Glucose: 98 mg/dL (ref 65–99)
Potassium: 3.9 mmol/L (ref 3.5–5.2)
Sodium: 141 mmol/L (ref 134–144)
TOTAL PROTEIN: 6.5 g/dL (ref 6.0–8.5)

## 2014-07-13 LAB — LIPID PANEL
Chol/HDL Ratio: 4.8 ratio units (ref 0.0–5.0)
Cholesterol, Total: 243 mg/dL — ABNORMAL HIGH (ref 100–199)
HDL: 51 mg/dL (ref >39)
LDL CALC: 163 mg/dL — AB (ref 0–99)
Triglycerides: 147 mg/dL (ref 0–149)
VLDL CHOLESTEROL CAL: 29 mg/dL (ref 5–40)

## 2014-07-13 LAB — HIV ANTIBODY (ROUTINE TESTING W REFLEX): HIV Screen 4th Generation wRfx: NONREACTIVE

## 2014-07-13 LAB — TSH: TSH: 3.15 u[IU]/mL (ref 0.450–4.500)

## 2014-07-17 ENCOUNTER — Encounter: Payer: Self-pay | Admitting: Family Medicine

## 2014-07-18 ENCOUNTER — Telehealth: Payer: Self-pay | Admitting: *Deleted

## 2014-07-18 NOTE — Telephone Encounter (Signed)
Calling pt regarding test results. 

## 2014-07-18 NOTE — Telephone Encounter (Signed)
-----   Message from Wardell Honour, MD sent at 07/13/2014  4:54 PM EDT ----- HIV screen negative. Thyroid is normal. Metabolic pane , including glucose, liver, renal function.  Total cholesterol up, mpstly the bad portion, LDL(we could treat but it is in a gray zone where Rx is mot mandatory)

## 2014-07-27 NOTE — Telephone Encounter (Signed)
Pt's sister notified of results Verbalizes understanding 

## 2014-07-27 NOTE — Telephone Encounter (Signed)
-----   Message from Wardell Honour, MD sent at 07/13/2014  4:54 PM EDT ----- HIV screen negative. Thyroid is normal. Metabolic pane , including glucose, liver, renal function.  Total cholesterol up, mpstly the bad portion, LDL(we could treat but it is in a gray zone where Rx is mot mandatory)

## 2014-09-12 ENCOUNTER — Other Ambulatory Visit: Payer: Self-pay | Admitting: Nurse Practitioner

## 2015-01-03 ENCOUNTER — Ambulatory Visit (INDEPENDENT_AMBULATORY_CARE_PROVIDER_SITE_OTHER): Payer: Managed Care, Other (non HMO) | Admitting: Family Medicine

## 2015-01-03 ENCOUNTER — Encounter (INDEPENDENT_AMBULATORY_CARE_PROVIDER_SITE_OTHER): Payer: Self-pay

## 2015-01-03 ENCOUNTER — Encounter: Payer: Self-pay | Admitting: Family Medicine

## 2015-01-03 ENCOUNTER — Telehealth (HOSPITAL_COMMUNITY): Payer: Self-pay | Admitting: *Deleted

## 2015-01-03 ENCOUNTER — Telehealth: Payer: Self-pay | Admitting: Family Medicine

## 2015-01-03 VITALS — BP 158/93 | HR 87 | Temp 97.3°F | Ht 68.0 in | Wt 187.8 lb

## 2015-01-03 DIAGNOSIS — R5383 Other fatigue: Secondary | ICD-10-CM | POA: Insufficient documentation

## 2015-01-03 DIAGNOSIS — R5382 Chronic fatigue, unspecified: Secondary | ICD-10-CM

## 2015-01-03 MED ORDER — PANTOPRAZOLE SODIUM 40 MG PO TBEC: 40.0000 mg | DELAYED_RELEASE_TABLET | Freq: Every day | ORAL | Status: DC

## 2015-01-03 MED ORDER — NAPROXEN 500 MG PO TABS: 500.0000 mg | ORAL_TABLET | Freq: Two times a day (BID) | ORAL | Status: DC

## 2015-01-03 NOTE — Addendum Note (Signed)
Addended by: Timmothy Euler on: 01/03/2015 08:31 AM   Modules accepted: Orders, Medications

## 2015-01-03 NOTE — Progress Notes (Addendum)
   HPI  Patient presents today fo fatigue  Findings 2-3 months of progressive fatigue. Seems to be getting worse but has been acutely worse over last 2-3 nights. He does not feel ill. He denies any dyspnea, chest pain, blood loss.  He does have a history of 3-5 episodes of gasping for air in the middle the night. He sleeps on one pillow but states that he does not really well on his back. He denies any significant leg edema.  He denies feelings of depression but does have anhedonia. He does snore quite a bit and states that at times he wakes himself up snoring, he never feels rested after a nights sleep.   PMH: Smoking status noted ROS: Per HPI  Objective: BP 158/93 mmHg  Pulse 87  Temp(Src) 97.3 F (36.3 C) (Oral)  Ht $R'5\' 8"'fv$  (1.727 m)  Wt 187 lb 12.8 oz (85.186 kg)  BMI 28.56 kg/m2 Gen: NAD, alert, cooperative with exam HEENT: NCAT CV: RRR, good S1/S2, no murmur Resp: CTABL, no wheezes, non-labored Abd: SNTND, BS present, no guarding or organomegaly Ext: 1+ pitting eddema BL LE Neuro: Alert and oriented, No gross deficits  Assessment and plan:  # fatigue Broad differential With PND and snoring , and Hx of B12 defficiency will plan to eval with blood work then Echo If echo and blood work unrevealing will set up Sleep study F/u 3-4 weeks.   Discussed daily mobic, Change to naproxyn as he is taking it scheduled to decrease r/o CVD, gastric, and renal implications. Alspo recommended PRN dosing trying to limit to work days.   Orders Placed This Encounter  Procedures  . CBC with Differential  . CMP14+EGFR  . TSH  . T4, Free  . Vitamin B12  . Echocardiogram    Fatigue with possible PND    Standing Status: Future     Number of Occurrences:      Standing Expiration Date: 04/03/2016    Order Specific Question:  Where should this test be performed    Answer:  [facility]    Order Specific Question:  Complete or Limited study?    Answer:  Complete    Order Specific  Question:  With Image Enhancing Agent or without Image Enhancing Agent?    Answer:  With Image Enhancing Agent    Order Specific Question:  Reason for exam-Echo    Answer:  Other - See Comments Section    Laroy Apple, MD [facility] 01/03/2015, 8:19 AM

## 2015-01-03 NOTE — Telephone Encounter (Signed)
No answer and no answering machine

## 2015-01-03 NOTE — Patient Instructions (Signed)
Great to meet you!  Come back in 3-4 weeks, we should have everything back by then.   Fatigue Fatigue is a feeling of tiredness, lack of energy, lack of motivation, or feeling tired all the time. Having enough rest, good nutrition, and reducing stress will normally reduce fatigue. Consult your caregiver if it persists. The nature of your fatigue will help your caregiver to find out its cause. The treatment is based on the cause.  CAUSES  There are many causes for fatigue. Most of the time, fatigue can be traced to one or more of your habits or routines. Most causes fit into one or more of three general areas. They are: Lifestyle problems  Sleep disturbances.  Overwork.  Physical exertion.  Unhealthy habits.  Poor eating habits or eating disorders.  Alcohol and/or drug use .  Lack of proper nutrition (malnutrition). Psychological problems  Stress and/or anxiety problems.  Depression.  Grief.  Boredom. Medical Problems or Conditions  Anemia.  Pregnancy.  Thyroid gland problems.  Recovery from major surgery.  Continuous pain.  Emphysema or asthma that is not well controlled  Allergic conditions.  Diabetes.  Infections (such as mononucleosis).  Obesity.  Sleep disorders, such as sleep apnea.  Heart failure or other heart-related problems.  Cancer.  Kidney disease.  Liver disease.  Effects of certain medicines such as antihistamines, cough and cold remedies, prescription pain medicines, heart and blood pressure medicines, drugs used for treatment of cancer, and some antidepressants. SYMPTOMS  The symptoms of fatigue include:   Lack of energy.  Lack of drive (motivation).  Drowsiness.  Feeling of indifference to the surroundings. DIAGNOSIS  The details of how you feel help guide your caregiver in finding out what is causing the fatigue. You will be asked about your present and past health condition. It is important to review all medicines that you  take, including prescription and non-prescription items. A thorough exam will be done. You will be questioned about your feelings, habits, and normal lifestyle. Your caregiver may suggest blood tests, urine tests, or other tests to look for common medical causes of fatigue.  TREATMENT  Fatigue is treated by correcting the underlying cause. For example, if you have continuous pain or depression, treating these causes will improve how you feel. Similarly, adjusting the dose of certain medicines will help in reducing fatigue.  HOME CARE INSTRUCTIONS   Try to get the required amount of good sleep every night.  Eat a healthy and nutritious diet, and drink enough water throughout the day.  Practice ways of relaxing (including yoga or meditation).  Exercise regularly.  Make plans to change situations that cause stress. Act on those plans so that stresses decrease over time. Keep your work and personal routine reasonable.  Avoid street drugs and minimize use of alcohol.  Start taking a daily multivitamin after consulting your caregiver. SEEK MEDICAL CARE IF:   You have persistent tiredness, which cannot be accounted for.  You have fever.  You have unintentional weight loss.  You have headaches.  You have disturbed sleep throughout the night.  You are feeling sad.  You have constipation.  You have dry skin.  You have gained weight.  You are taking any new or different medicines that you suspect are causing fatigue.  You are unable to sleep at night.  You develop any unusual swelling of your legs or other parts of your body. SEEK IMMEDIATE MEDICAL CARE IF:   You are feeling confused.  Your vision is blurred.  You feel faint or pass out.  You develop severe headache.  You develop severe abdominal, pelvic, or back pain.  You develop chest pain, shortness of breath, or an irregular or fast heartbeat.  You are unable to pass a normal amount of urine.  You develop abnormal  bleeding such as bleeding from the rectum or you vomit blood.  You have thoughts about harming yourself or committing suicide.  You are worried that you might harm someone else. MAKE SURE YOU:   Understand these instructions.  Will watch your condition.  Will get help right away if you are not doing well or get worse. Document Released: 02/02/2007 Document Revised: 06/30/2011 Document Reviewed: 08/09/2013 [facility] Patient Information 2015 [place], [place]. This information is not intended to replace advice given to you by your health care provider. Make sure you discuss any questions you have with your health care provider.

## 2015-01-04 ENCOUNTER — Telehealth: Payer: Self-pay

## 2015-01-04 DIAGNOSIS — R0683 Snoring: Secondary | ICD-10-CM | POA: Insufficient documentation

## 2015-01-04 DIAGNOSIS — R0600 Dyspnea, unspecified: Secondary | ICD-10-CM | POA: Insufficient documentation

## 2015-01-04 DIAGNOSIS — R5382 Chronic fatigue, unspecified: Secondary | ICD-10-CM

## 2015-01-04 LAB — CMP14+EGFR
ALT: 55 IU/L — AB (ref 0–44)
AST: 31 IU/L (ref 0–40)
Albumin/Globulin Ratio: 1.8 (ref 1.1–2.5)
Albumin: 4.2 g/dL (ref 3.5–5.5)
Alkaline Phosphatase: 67 IU/L (ref 39–117)
BILIRUBIN TOTAL: 0.6 mg/dL (ref 0.0–1.2)
BUN/Creatinine Ratio: 18 (ref 9–20)
BUN: 17 mg/dL (ref 6–24)
CO2: 24 mmol/L (ref 18–29)
Calcium: 9.5 mg/dL (ref 8.7–10.2)
Chloride: 101 mmol/L (ref 97–108)
Creatinine, Ser: 0.95 mg/dL (ref 0.76–1.27)
GFR calc Af Amer: 104 mL/min/{1.73_m2} (ref >59)
GFR calc non Af Amer: 90 mL/min/{1.73_m2} (ref >59)
Globulin, Total: 2.3 g/dL (ref 1.5–4.5)
Glucose: 103 mg/dL — ABNORMAL HIGH (ref 65–99)
Potassium: 4.6 mmol/L (ref 3.5–5.2)
Sodium: 143 mmol/L (ref 134–144)
Total Protein: 6.5 g/dL (ref 6.0–8.5)

## 2015-01-04 LAB — TSH: TSH: 2.75 u[IU]/mL (ref 0.450–4.500)

## 2015-01-04 LAB — VITAMIN B12: Vitamin B-12: 272 pg/mL (ref 211–946)

## 2015-01-04 LAB — CBC WITH DIFFERENTIAL/PLATELET
Basophils Absolute: 0 10*3/uL (ref 0.0–0.2)
Basos: 0 %
EOS (ABSOLUTE): 0.2 10*3/uL (ref 0.0–0.4)
Eos: 2 %
Hematocrit: 45.2 % (ref 37.5–51.0)
Hemoglobin: 14.9 g/dL (ref 12.6–17.7)
Immature Grans (Abs): 0 10*3/uL (ref 0.0–0.1)
Immature Granulocytes: 0 %
Lymphocytes Absolute: 2.3 10*3/uL (ref 0.7–3.1)
Lymphs: 25 %
MCH: 30.5 pg (ref 26.6–33.0)
MCHC: 33 g/dL (ref 31.5–35.7)
MCV: 92 fL (ref 79–97)
Monocytes Absolute: 0.9 10*3/uL (ref 0.1–0.9)
Monocytes: 10 %
NEUTROS ABS: 5.6 10*3/uL (ref 1.4–7.0)
Neutrophils: 63 %
Platelets: 265 10*3/uL (ref 150–379)
RBC: 4.89 x10E6/uL (ref 4.14–5.80)
RDW: 13.1 % (ref 12.3–15.4)
WBC: 9 10*3/uL (ref 3.4–10.8)

## 2015-01-04 LAB — T4, FREE: Free T4: 1.15 ng/dL (ref 0.82–1.77)

## 2015-01-04 NOTE — Telephone Encounter (Signed)
I agree, refer to cardiology. Would benefit from echo and sleep study.   Laroy Apple, MD [facility] 01/04/2015, 5:22 PM

## 2015-01-04 NOTE — Telephone Encounter (Signed)
Insurance denied echocardiogram as not a covered service     We can refer to cardiology if you'd like?

## 2015-01-04 NOTE — Telephone Encounter (Signed)
LMOM as per DPR of 06/2014 instructing pt we did not perform STD testing at his OV of 01/03/2015

## 2015-01-15 ENCOUNTER — Encounter: Payer: Self-pay | Admitting: *Deleted

## 2015-01-29 ENCOUNTER — Ambulatory Visit: Payer: Managed Care, Other (non HMO) | Admitting: Family Medicine

## 2015-01-30 ENCOUNTER — Encounter: Payer: Self-pay | Admitting: Family Medicine

## 2015-01-30 ENCOUNTER — Ambulatory Visit (INDEPENDENT_AMBULATORY_CARE_PROVIDER_SITE_OTHER): Payer: Managed Care, Other (non HMO) | Admitting: Family Medicine

## 2015-01-30 VITALS — BP 148/91 | HR 80 | Temp 97.0°F | Ht 68.0 in | Wt 191.4 lb

## 2015-01-30 DIAGNOSIS — Z23 Encounter for immunization: Secondary | ICD-10-CM | POA: Diagnosis not present

## 2015-01-30 DIAGNOSIS — F329 Major depressive disorder, single episode, unspecified: Secondary | ICD-10-CM | POA: Diagnosis not present

## 2015-01-30 DIAGNOSIS — F32A Depression, unspecified: Secondary | ICD-10-CM

## 2015-01-30 MED ORDER — CITALOPRAM HYDROBROMIDE 20 MG PO TABS: 20.0000 mg | ORAL_TABLET | Freq: Every day | ORAL | Status: DC

## 2015-01-30 NOTE — Progress Notes (Signed)
   HPI  Patient presents today for follow-up of fatigue.  On her last visit we did blood work which was unrevealing, we attempted to order an echo which was denied by his insurance.  He continues to have generalized fatigue, severe snoring PND resolved and hasnt recurred  He denies fever, chills, sweats, change in appetite.  He also brings up concerns about depression He reports about 1-2 years of anhedonia, intermittent depressed feelings, low energy He denies suicidal ideation He is easily year to double He denies anxiety  PMH: Smoking status noted ROS: Per HPI  Objective: BP 148/91 mmHg  Pulse 80  Temp(Src) 97 F (36.1 C) (Oral)  Ht 5\' 8"  (1.727 m)  Wt 191 lb 6.4 oz (86.818 kg)  BMI 29.11 kg/m2 Gen: NAD, alert, cooperative with exam HEENT: NCAT CV: RRR, good S1/S2, no murmur Resp: CTABL, no wheezes, non-labored Ext: No edema, warm Neuro: Alert and oriented, No gross deficits Psych: Normal mood and affect, no suicidal ideation  Assessment and plan:  # Fatigue Chronic fatigue, unclear etiology, previously was concerned about peroxisomal nocturnal dyspnea, this seems to have resolved. More concerned now with depression +/-sleep apnea Will order a sleep study, however he has a sleep center that his dentist is recommended he will call with the name  # Depression Started Celexa No SI Follow-up 2-4 weeks, we will plan to maximize dose at that time.    Meds ordered this encounter  Medications  . citalopram (CELEXA) 20 MG tablet    Sig: Take 1 tablet (20 mg total) by mouth daily.    Dispense:  30 tablet    Refill:  [place], MD [facility] 01/30/2015, 8:46 AM

## 2015-01-30 NOTE — Patient Instructions (Signed)
Great to see you!  Lets see you back in 2-4 week sto make sure the new medicine is treating you right and we get your sleep study arranged appropriately  Call us and leave a message with teh sleep center you want to use or bring it in on follow up.   Taking the medicine as directed and not missing any doses is one of the best things you can do to treat your depression.  Here are some things to keep in mind:  1) Side effects (stomach upset, some increased anxiety) may happen before you notice a benefit.  These side effects typically go away over time. 2) Changes to your dose of medicine or a change in medication all together is sometimes necessary 3) Most people need to be on medication at least 6-12 months 4) Many people will notice an improvement within two weeks but the full effect of the medication can take up to 4-6 weeks 5) Stopping the medication when you start feeling better often results in a return of symptoms 6) If you start having thoughts of hurting yourself or others after starting this medicine, please call me a [phone]

## 2015-01-31 ENCOUNTER — Telehealth (HOSPITAL_COMMUNITY): Payer: Self-pay | Admitting: *Deleted

## 2015-02-05 MED ORDER — MELOXICAM 15 MG PO TABS: 15.0000 mg | ORAL_TABLET | Freq: Every day | ORAL | Status: DC

## 2015-02-05 NOTE — Telephone Encounter (Signed)
Change naproxen to motivate per patient's request.  Somnolence with SSRI, will ask him to half the dose and follow-up as previously planned. Could consider wellbutrin if somnolence is too big of an issue.   Laroy Apple, MD [facility] 02/05/2015, 9:54 AM

## 2015-02-05 NOTE — Telephone Encounter (Signed)
Patient states that he has been real drowsy since starting the celexa. He feels that the naproxen is not working at good as the [employer] and would like to go back on Mobic.

## 2015-02-05 NOTE — Telephone Encounter (Signed)
Patient aware and will try 1/2 dose of celexa for 2 weeks and then go up to a whole dose.

## 2015-02-20 ENCOUNTER — Ambulatory Visit (INDEPENDENT_AMBULATORY_CARE_PROVIDER_SITE_OTHER): Payer: Managed Care, Other (non HMO) | Admitting: Family Medicine

## 2015-02-20 ENCOUNTER — Encounter: Payer: Self-pay | Admitting: Family Medicine

## 2015-02-20 VITALS — BP 151/89 | HR 81 | Temp 98.3°F | Ht 68.0 in | Wt 187.2 lb

## 2015-02-20 DIAGNOSIS — F32A Depression, unspecified: Secondary | ICD-10-CM

## 2015-02-20 DIAGNOSIS — Z87891 Personal history of nicotine dependence: Secondary | ICD-10-CM

## 2015-02-20 DIAGNOSIS — Z23 Encounter for immunization: Secondary | ICD-10-CM | POA: Diagnosis not present

## 2015-02-20 DIAGNOSIS — Z113 Encounter for screening for infections with a predominantly sexual mode of transmission: Secondary | ICD-10-CM | POA: Diagnosis not present

## 2015-02-20 DIAGNOSIS — R5382 Chronic fatigue, unspecified: Secondary | ICD-10-CM | POA: Diagnosis not present

## 2015-02-20 DIAGNOSIS — F329 Major depressive disorder, single episode, unspecified: Secondary | ICD-10-CM

## 2015-02-20 MED ORDER — BUPROPION HCL ER (SR) 150 MG PO TB12: 150.0000 mg | ORAL_TABLET | Freq: Two times a day (BID) | ORAL | Status: DC

## 2015-02-20 NOTE — Patient Instructions (Signed)
Great to see you!  wellbutrin- Start with 1 pill daily for the first 3 days, then go to 1 pill twice daily.  Set a quit date 2 weeks after you start wellbutrin.   Stop celexa  Come back in 2-4 weeks.   Next time we will plan to give you a pneumonia vaccine.

## 2015-02-20 NOTE — Progress Notes (Addendum)
   HPI  Patient presents today here for follow-up fatigue and healthcare maintenance.  Fatigue, depression Patient history Celexa, he states that it causes him a lot of somnolence fatigue. He still denies suicidal ideation She would not try different medication. He denies anxiety.  Smoking Hehas cut back Quite a bit down to about 4-5 cigarettes daily, one pack he states lasts him about one week. He would like to try to quit with starting Wellbutrin.  He would like to get screened for HIV after high-risk encounter earlier this summer.  PMH: Smoking status noted - trying to quit ROS: Per HPI  Objective: BP 151/89 mmHg  Pulse 81  Temp(Src) 98.3 F (36.8 C) (Oral)  Ht $R'5\' 8"'ut$  (1.727 m)  Wt 187 lb 3.2 oz (84.913 kg)  BMI 28.47 kg/m2 Gen: NAD, alert, cooperative with exam HEENT: NCAT CV: RRR, good S1/S2, no murmur Resp: CTABL, no wheezes, non-labored Ext: No edema, warm Neuro: Alert and oriented, No gross deficits Psych: No SI  PHQ-9 score 6, no SI  Assessment and plan:  # Depression, fatigue Chang eto wellbutrin No Si F/u 2-4 weeks  # Tobacco abuse Trying to stop with wellbutrin  # STI screening HIV and RPR NO symptoms of [place]    Orders Placed This Encounter  Procedures  . Varicella-zoster vaccine subcutaneous  . RPR  . HIV antibody  . BMP8+EGFR    Meds ordered this encounter  Medications  . buPROPion (WELLBUTRIN SR) 150 MG 12 hr tablet    Sig: Take 1 tablet (150 mg total) by mouth 2 (two) times daily.    Dispense:  60 tablet    Refill:  [place], MD [facility] 02/20/2015, 9:10 AM

## 2015-02-21 LAB — BMP8+EGFR
BUN / CREAT RATIO: 12 (ref 9–20)
BUN: 12 mg/dL (ref 6–24)
CALCIUM: 9.4 mg/dL (ref 8.7–10.2)
CHLORIDE: 97 mmol/L (ref 97–106)
CO2: 25 mmol/L (ref 18–29)
Creatinine, Ser: 0.98 mg/dL (ref 0.76–1.27)
GFR calc non Af Amer: 86 mL/min/{1.73_m2} (ref >59)
GFR, EST AFRICAN AMERICAN: 100 mL/min/{1.73_m2} (ref >59)
Glucose: 101 mg/dL — ABNORMAL HIGH (ref 65–99)
POTASSIUM: 4.3 mmol/L (ref 3.5–5.2)
Sodium: 138 mmol/L (ref 136–144)

## 2015-02-21 LAB — RPR: RPR: NONREACTIVE

## 2015-02-21 LAB — HIV ANTIBODY (ROUTINE TESTING W REFLEX): HIV SCREEN 4TH GENERATION: NONREACTIVE

## 2015-03-20 ENCOUNTER — Ambulatory Visit (INDEPENDENT_AMBULATORY_CARE_PROVIDER_SITE_OTHER): Payer: Managed Care, Other (non HMO) | Admitting: Family Medicine

## 2015-03-20 ENCOUNTER — Encounter: Payer: Self-pay | Admitting: Family Medicine

## 2015-03-20 VITALS — BP 143/87 | HR 85 | Temp 97.7°F | Ht 68.0 in | Wt 190.2 lb

## 2015-03-20 DIAGNOSIS — R5382 Chronic fatigue, unspecified: Secondary | ICD-10-CM | POA: Diagnosis not present

## 2015-03-20 DIAGNOSIS — F329 Major depressive disorder, single episode, unspecified: Secondary | ICD-10-CM

## 2015-03-20 DIAGNOSIS — F32A Depression, unspecified: Secondary | ICD-10-CM

## 2015-03-20 MED ORDER — BUPROPION HCL ER (XL) 300 MG PO TB24: 300.0000 mg | ORAL_TABLET | Freq: Every day | ORAL | Status: DC

## 2015-03-20 MED ORDER — HYDROCHLOROTHIAZIDE 12.5 MG PO CAPS: 12.5000 mg | ORAL_CAPSULE | Freq: Every day | ORAL | Status: DC

## 2015-03-20 NOTE — Progress Notes (Signed)
   HPI  Patient presents today here for f/u depression and fatigue  Fraction, fatigue Improved on Wellbutrin No GI upset, will denies suicidal ideation No anxiety. He states that he feels much better at work and has much more energy. He would like to go to once a day pill as he is forgetting his second pill occasionally.  Elevated blood pressure He checks his blood pressure intermittently at work, he states that it's usually 140-150 over 80s He would like to start medication. He denies chest pain, dyspnea, palpitations, leg edema.  Has noticed a left groin lump consistent with his previous inguinal hernia which has been repaired. He states that only irritating, he denies any erythema, tenderness to palpation, or severe pain. He does not want to see a surgeon for now  PMH: Smoking status noted ROS: Per HPI  Objective: BP 143/87 mmHg  Pulse 85  Temp(Src) 97.7 F (36.5 C) (Oral)  Ht 5\' 8"  (1.727 m)  Wt 190 lb 3.2 oz (86.274 kg)  BMI 28.93 kg/m2 Gen: NAD, alert, cooperative with exam HEENT: NCAT CV: RRR, good S1/S2, no murmur Resp: CTABL, no wheezes, non-labored Ext: No edema, warm Neuro: Alert and oriented, No gross deficits  Recheck manual blood pressure is consistent  PHQ-9: 4- NO SI  Assessment and plan:  # Depression, fatigue Improved on Wellbutrin Change to Wellbutrin XL 300 mg once daily  # Hypertension New diagnosis Generally elevated outside of the clinic as well Start HCTZ 12.5 mg Repeat labs next month Follow-up 4-6 weeks  Vitals - 1 value per visit 03/20/2015 02/20/2015 01/30/2015 [id number]  SYSTOLIC [id number]  DIASTOLIC 87 89 91 93  Pulse 85 81 80 87   # HCM Needs Pneumovax as he is a smoker Recommended he call his insurance company to ensure that they will pay for it prior to giving  Meds ordered this encounter  Medications  . buPROPion (WELLBUTRIN XL) 300 MG 24 hr tablet    Sig: Take 1 tablet (300 mg total) by mouth daily.   Dispense:  30 tablet    Refill:  [place], MD [facility] 03/20/2015, 8:15 AM

## 2015-03-20 NOTE — Patient Instructions (Signed)
Great to see you!  I have sent the new Rx, lets see you back in 4 months or so.   You are always welcome to come back sooner if needed.

## 2015-05-03 ENCOUNTER — Ambulatory Visit: Payer: Managed Care, Other (non HMO) | Admitting: Family Medicine

## 2015-05-07 ENCOUNTER — Ambulatory Visit (INDEPENDENT_AMBULATORY_CARE_PROVIDER_SITE_OTHER): Payer: Managed Care, Other (non HMO) | Admitting: Family Medicine

## 2015-05-07 ENCOUNTER — Encounter: Payer: Self-pay | Admitting: Family Medicine

## 2015-05-07 VITALS — BP 143/88 | HR 87 | Temp 98.0°F | Ht 68.0 in | Wt 189.4 lb

## 2015-05-07 DIAGNOSIS — I10 Essential (primary) hypertension: Secondary | ICD-10-CM

## 2015-05-07 MED ORDER — BUPROPION HCL ER (XL) 450 MG PO TB24: 450.0000 mg | ORAL_TABLET | Freq: Every day | ORAL | Status: DC

## 2015-05-07 MED ORDER — BUPROPION HCL ER (XL) 450 MG PO TB24: 300.0000 mg | ORAL_TABLET | Freq: Every day | ORAL | Status: DC

## 2015-05-07 MED ORDER — LISINOPRIL-HYDROCHLOROTHIAZIDE 10-12.5 MG PO TABS
1.0000 | ORAL_TABLET | Freq: Every day | ORAL | Status: DC
Start: 2015-05-07 — End: 2015-05-17

## 2015-05-07 NOTE — Patient Instructions (Signed)
Great to see you!  I have sent a new Rx for a larger dose of wellbutrin I have also replaced HCTZ with Lisinopril/HCTZ  Lets see you again in 4-6 weeks  Please keep a blood pressure log

## 2015-05-07 NOTE — Progress Notes (Signed)
   HPI  Patient presents today to discuss fatigue, anxiety, hypertension.  Patient has been taking HCTZ regularly. No dizziness, weakness Has had occasional headache  Depression No suicidal ideation Fatigue and anxiety seem the same to slightly worse. He does not limit added additional medications. It is not interrupting his work.  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 He had previous improvement on Wellbutrin, now he seems to Bactrim belittled. We'll go ahead and increase Wellbutrin to 450 mg daily. Follow up 4-6 weeks.    Laroy Apple, MD [facility] 05/07/2015, 8:22 AM

## 2015-05-08 ENCOUNTER — Telehealth: Payer: Self-pay | Admitting: Family Medicine

## 2015-05-08 LAB — BMP8+EGFR
BUN/Creatinine Ratio: 11 (ref 9–20)
BUN: 10 mg/dL (ref 6–24)
CO2: 24 mmol/L (ref 18–29)
CREATININE: 0.92 mg/dL (ref 0.76–1.27)
Calcium: 9.2 mg/dL (ref 8.7–10.2)
Chloride: 98 mmol/L (ref 96–106)
GFR, EST AFRICAN AMERICAN: 108 mL/min/{1.73_m2} (ref >59)
GFR, EST NON AFRICAN AMERICAN: 93 mL/min/{1.73_m2} (ref >59)
Glucose: 103 mg/dL — ABNORMAL HIGH (ref 65–99)
POTASSIUM: 4.2 mmol/L (ref 3.5–5.2)
SODIUM: 141 mmol/L (ref 134–144)

## 2015-05-08 NOTE — Telephone Encounter (Signed)
Was taken care of by [facility]

## 2015-05-08 NOTE — Telephone Encounter (Deleted)
Call was taken care of by [facility]

## 2015-05-17 ENCOUNTER — Telehealth: Payer: Self-pay | Admitting: *Deleted

## 2015-05-17 MED ORDER — HYDROCHLOROTHIAZIDE 12.5 MG PO CAPS: 12.5000 mg | ORAL_CAPSULE | Freq: Every day | ORAL | Status: DC

## 2015-05-17 NOTE — Telephone Encounter (Signed)
Stp and pt states he thinks since he started the lisinopril with hctz at past visit he has been feeling achey and worn down and he says his pulse has ranged from 100-120 in the middle of his work shift he didn't know if it was the medication. Please advise.

## 2015-05-17 NOTE — Telephone Encounter (Signed)
Its possible that it is due to blood pressure meds.   Go back to HCTZ alone.   If he is continuously feeling this way with dyspnea, chest pain, or other concerning symptoms we should see him.  If his heart rate is persistently elevated evenr after changing we should see him as well.   I have sent HCTZ script.   Laroy Apple, MD [facility] 05/17/2015, 12:16 PM

## 2015-05-22 NOTE — Telephone Encounter (Signed)
Patient aware.

## 2015-06-05 ENCOUNTER — Encounter: Payer: Self-pay | Admitting: Family Medicine

## 2015-06-05 ENCOUNTER — Ambulatory Visit (INDEPENDENT_AMBULATORY_CARE_PROVIDER_SITE_OTHER): Payer: Managed Care, Other (non HMO) | Admitting: Family Medicine

## 2015-06-05 VITALS — BP 146/80 | HR 101 | Temp 97.6°F | Ht 68.0 in | Wt 185.6 lb

## 2015-06-05 DIAGNOSIS — M159 Polyosteoarthritis, unspecified: Secondary | ICD-10-CM | POA: Insufficient documentation

## 2015-06-05 DIAGNOSIS — I10 Essential (primary) hypertension: Secondary | ICD-10-CM | POA: Diagnosis not present

## 2015-06-05 MED ORDER — HYDROCHLOROTHIAZIDE 25 MG PO TABS: 25.0000 mg | ORAL_TABLET | Freq: Every day | ORAL | Status: DC

## 2015-06-05 MED ORDER — MELOXICAM 7.5 MG PO TABS: 7.5000 mg | ORAL_TABLET | Freq: Every day | ORAL | Status: DC

## 2015-06-05 NOTE — Patient Instructions (Signed)
Great to see you!  Lets see you again in 6-8 weeks (eventually we will do once every 6 months)  I have increased HCTZ to 25 mg  I have decreased meloxicam to 7.5 mg

## 2015-06-05 NOTE — Progress Notes (Signed)
   HPI  Patient presents today in follow-up for hypertension and osteoarthritis.  Hypertension Good medication compliance He tried lisinopril but had muscle aches and body aches and had to quit. He's been taking 12.5 mg of hydrochlorothiazide regularly. Home blood pressures have been 140-150/80-90 No headache, chest pain, dyspnea, palpitations, leg edema.  Osteoarthritis Has been taking 15 mg of meloxicam for 1+ year. He has good relief with this. He requests refill. No history of PUD  PMH: Smoking status noted ROS: Per HPI  Objective: BP 146/80 mmHg  Pulse 101  Temp(Src) 97.6 F (36.4 C) (Oral)  Ht 5\' 8"  (1.727 m)  Wt 185 lb 9.6 oz (84.188 kg)  BMI 28.23 kg/m2 Gen: NAD, alert, cooperative with exam HEENT: NCAT CV: RRR, good S1/S2, no murmur Resp: CTABL, no wheezes, non-labored Ext: No edema, warm Neuro: Alert and oriented, No gross deficits  Assessment and plan:  # Retention Still elevated Increase HCTZ to 25 mg I believe that this will bring him just under [id number] systolic. Did not tolerate ACE inhibitor  # Osteoarthritis decrease meloxicam 7.5 mg, try to decrease overall in NSAID burden   # Tobacco Abuse Has quit now 3 weeks ago- contratulated   Meds ordered this encounter  Medications  . meloxicam (MOBIC) 7.5 MG tablet    Sig: Take 1 tablet (7.5 mg total) by mouth daily.    Dispense:  30 tablet    Refill:  3  . hydrochlorothiazide (HYDRODIURIL) 25 MG tablet    Sig: Take 1 tablet (25 mg total) by mouth daily.    Dispense:  90 tablet    Refill:  [place], MD [place] 06/05/2015, 8:17 AM

## 2015-06-22 ENCOUNTER — Telehealth: Payer: Self-pay | Admitting: Family Medicine

## 2015-06-22 MED ORDER — MELOXICAM 15 MG PO TABS: 15.0000 mg | ORAL_TABLET | Freq: Every day | ORAL | Status: DC

## 2015-06-22 NOTE — Telephone Encounter (Signed)
Pt aware.

## 2015-06-22 NOTE — Telephone Encounter (Signed)
changed back to 15 mg.   Laroy Apple, MD [facility] 06/22/2015, 4:32 PM

## 2015-08-06 ENCOUNTER — Encounter: Payer: Self-pay | Admitting: Family Medicine

## 2015-08-06 ENCOUNTER — Ambulatory Visit (INDEPENDENT_AMBULATORY_CARE_PROVIDER_SITE_OTHER): Payer: Managed Care, Other (non HMO) | Admitting: Family Medicine

## 2015-08-06 VITALS — BP 156/91 | HR 99 | Temp 97.4°F | Ht 68.0 in | Wt 184.2 lb

## 2015-08-06 DIAGNOSIS — E785 Hyperlipidemia, unspecified: Secondary | ICD-10-CM | POA: Diagnosis not present

## 2015-08-06 DIAGNOSIS — I10 Essential (primary) hypertension: Secondary | ICD-10-CM

## 2015-08-06 DIAGNOSIS — M19041 Primary osteoarthritis, right hand: Secondary | ICD-10-CM | POA: Diagnosis not present

## 2015-08-06 MED ORDER — AMLODIPINE BESYLATE 5 MG PO TABS: 5.0000 mg | ORAL_TABLET | Freq: Every day | ORAL | Status: DC

## 2015-08-06 NOTE — Addendum Note (Signed)
Addended by: Nigel Berthold C on: 08/06/2015 03:13 PM   Modules accepted: Orders, SmartSet

## 2015-08-06 NOTE — Progress Notes (Signed)
   HPI  Patient presents today here for follow-up hypertension, hyperlipidemia, finger pain.  Finger pain Describes right knuckle pain that's been going on for about 2 weeks. 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 No swelling The redness Meloxicam not helping much.  Hyperlipidemia Not started any medicines, has been over one year since his last check. Would like to try diet and exercise before medications.  Hypertension Averaging 130s- 140s over 90s at home Has been 170s at work. No chest pain, palpitations, leg edema.  PMH: Smoking status noted ROS: Per HPI  Objective: BP 156/91 mmHg  Pulse 99  Temp(Src) 97.4 F (36.3 C) (Oral)  Ht 5\' 8"  (1.727 m)  Wt 184 lb 3.2 oz (83.553 kg)  BMI 28.01 kg/m2 Gen: NAD, alert, cooperative with exam HEENT: NCAT CV: RRR, good S1/S2, no murmur Resp: CTABL, no wheezes, non-labored Ext: No edema, warm Neuro: Alert and oriented, No gross deficits  Assessment and plan:  # Hypertension Uncontrolled Add amlodipine, continue HCTZ Repeat labs in 3 months.  # Hyperlipidemia Diet and exercise discussed Repeat labs in 3 months Would likely qualify for statin  #Finger osteoarthritis Discussed conservative therapy Ice, NSAIDs, Tried de-escalating mobic which had re-emergence of symps    Meds ordered this encounter  Medications  . amLODipine (NORVASC) 5 MG tablet    Sig: Take 1 tablet (5 mg total) by mouth daily.    Dispense:  30 tablet    Refill:  [place], MD [facility] 08/06/2015, 8:44 AM

## 2015-08-06 NOTE — Patient Instructions (Signed)
Great to see you!  Start amlodipine 1 pill once a day in addition to your other piils, it is for blood pressure.   Our goal is less than 140/90  Come back in July or august and we will re-check your cholesterol.

## 2015-08-09 ENCOUNTER — Telehealth: Payer: Self-pay | Admitting: Family Medicine

## 2015-08-09 MED ORDER — HYDROCHLOROTHIAZIDE 25 MG PO TABS: 25.0000 mg | ORAL_TABLET | Freq: Every day | ORAL | Status: DC

## 2015-08-09 MED ORDER — MELOXICAM 15 MG PO TABS: 15.0000 mg | ORAL_TABLET | Freq: Every day | ORAL | Status: DC

## 2015-08-09 NOTE — Telephone Encounter (Signed)
Spoke with pharmacy and they did not receive the 90 day supply I resent rx for pharmacy and they will call us back and let us know if they do not receive.

## 2015-09-21 ENCOUNTER — Encounter: Payer: Self-pay | Admitting: *Deleted

## 2015-11-05 ENCOUNTER — Ambulatory Visit: Payer: Managed Care, Other (non HMO) | Admitting: Family Medicine

## 2015-11-07 ENCOUNTER — Ambulatory Visit (INDEPENDENT_AMBULATORY_CARE_PROVIDER_SITE_OTHER): Payer: Managed Care, Other (non HMO) | Admitting: Family Medicine

## 2015-11-07 ENCOUNTER — Encounter: Payer: Self-pay | Admitting: Family Medicine

## 2015-11-07 ENCOUNTER — Ambulatory Visit (INDEPENDENT_AMBULATORY_CARE_PROVIDER_SITE_OTHER): Payer: Managed Care, Other (non HMO)

## 2015-11-07 VITALS — BP 125/75 | HR 85 | Temp 97.2°F | Ht 68.0 in | Wt 180.8 lb

## 2015-11-07 DIAGNOSIS — M79641 Pain in right hand: Secondary | ICD-10-CM

## 2015-11-07 DIAGNOSIS — I10 Essential (primary) hypertension: Secondary | ICD-10-CM | POA: Diagnosis not present

## 2015-11-07 DIAGNOSIS — E785 Hyperlipidemia, unspecified: Secondary | ICD-10-CM

## 2015-11-07 MED ORDER — ATORVASTATIN CALCIUM 20 MG PO TABS: 20.0000 mg | ORAL_TABLET | Freq: Every day | ORAL | Status: DC

## 2015-11-07 NOTE — Patient Instructions (Signed)
Great to see you!  We will call with results from your x ray and labs within 1 week.   Let me know if you would like to see a sports medicine doctor for more recommendations on your hand pain.   We will probably recommend taking a cholesterol medicine oif your cholesterol is still elevated.   Cholesterol Cholesterol is a white, waxy, fat-like substance needed by your body in small amounts. The liver makes all the cholesterol you need. Cholesterol is carried from the liver by the blood through the blood vessels. Deposits of cholesterol (plaque) may build up on blood vessel walls. These make the arteries narrower and stiffer. Cholesterol plaques increase the risk for heart attack and stroke.  You cannot feel your cholesterol level even if it is very high. The only way to know it is high is with a blood test. Once you know your cholesterol levels, you should keep a record of the test results. Work with your health care provider to keep your levels in the desired range.  WHAT DO THE RESULTS MEAN?  Total cholesterol is a rough measure of all the cholesterol in your blood.   LDL is the so-called bad cholesterol. This is the type that deposits cholesterol in the walls of the arteries. You want this level to be low.   HDL is the good cholesterol because it cleans the arteries and carries the LDL away. You want this level to be high.  Triglycerides are fat that the body can either burn for energy or store. High levels are closely linked to heart disease.  WHAT ARE THE DESIRED LEVELS OF CHOLESTEROL?  Total cholesterol below 200.   LDL below 100 for people at risk, below 70 for those at very high risk.   HDL above 50 is good, above 60 is best.   Triglycerides below 150.  HOW CAN I LOWER MY CHOLESTEROL?  Diet. Follow your diet programs as directed by your health care provider.   Choose fish or white meat chicken and [place], roasted or baked. Limit fatty cuts of red meat, fried foods, and  processed meats, such as sausage and lunch meats.   Eat lots of fresh fruits and vegetables.  Choose whole grains, beans, pasta, potatoes, and cereals.   Use only small amounts of olive, corn, or canola oils.   Avoid butter, mayonnaise, shortening, or palm kernel oils.  Avoid foods with trans fats.   Drink skim or nonfat milk and eat low-fat or nonfat yogurt and cheeses. Avoid whole milk, cream, ice cream, egg yolks, and full-fat cheeses.   Healthy desserts include angel food cake, ginger snaps, animal crackers, hard candy, popsicles, and low-fat or nonfat frozen yogurt. Avoid pastries, cakes, pies, and cookies.   Exercise. Follow your exercise programs as directed by your health care provider.   A regular program helps decrease LDL and raise HDL.   A regular program helps with weight control.   Do things that increase your activity level like gardening, walking, or taking the stairs. Ask your health care provider about how you can be more active in your daily life.   Medicine. Take medicine only as directed by your health care provider.   Medicine may be prescribed by your health care provider to help lower cholesterol and decrease the risk for heart disease.   If you have several risk factors, you may need medicine even if your levels are normal.   This information is not intended to replace advice given to you  by your health care provider. Make sure you discuss any questions you have with your health care provider.   Document Released: 12/31/2000 Document Revised: 04/28/2014 Document Reviewed: 01/19/2013 Elsevier Interactive Patient Education [employer].

## 2015-11-07 NOTE — Addendum Note (Signed)
Addended by: Timmothy Euler on: 11/07/2015 02:52 PM   Modules accepted: Miquel Dunn

## 2015-11-07 NOTE — Progress Notes (Addendum)
   HPI  Patient presents today here for follow-up of right hand pain, hypertension, and hyperlipidemia.  Hypertension Good medication compliance No chest pain, dyspnea, palpitations, leg edema Active at work but no formal exercise regimen.  Right hand pain Continues to her mainly at the second MCP. 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 Also has right lateral elbow pain that started after shooting his bow several hours a about a year ago. Its been waxing and waning.  No neck pain or weakness in the hand.  Hyperlipidemia Not really watching his diet Okay to take medications, does not want blood work today but is willing to take medications  PMH: Smoking status noted ROS: Per HPI  Objective: BP 125/75 mmHg  Pulse 85  Temp(Src) 97.2 F (36.2 C) (Oral)  Ht '5\' 8"'$  (1.727 m)  Wt 180 lb 12.8 oz (82.01 kg)  BMI 27.50 kg/m2 Gen: NAD, alert, cooperative with exam HEENT: NCAT, EOMI, PERRLd S1/S2, no murmur Resp: CTABL, no wheezes, non-labored Ext: No edema, warm Neuro: Alert and oriented, No gross deficits Musculoskeletal: Right hand with tenderness to palpation over the second MCP, no gross deformity, swelling, or erythema. He has resting Flexion of the fourth and fifth digits at the PIP on bilateral hands.   Plain film of the hand without any acute abnormality today.  Assessment and plan:  # Right hand pain Likely osteoarthritis, x-rays to confirm today We have tried to de-escalate meloxicam previously which is treating knee pain, he did not tolerate this. For now will continue meloxicam I offered sports medicine evaluation if he would like more recommendations, he would like to defer for now  # Hyperlipidemia Previous LDL greater than 160, start atorvastatin He does not want to recheck blood work today, plans to recheck in 3-4 months  # Hypertension Well-controlled on amlodipine and  HCTZ     Orders Placed This Encounter  Procedures  . DG Hand Complete Right    Standing Status: Future     Number of Occurrences:      Standing Expiration Date: 01/07/2017    Order Specific Question:  Reason for Exam (SYMPTOM  OR DIAGNOSIS REQUIRED)    Answer:  pain at 2nd MCP, likely OA    Order Specific Question:  Preferred imaging location?    Answer:  External  . CMP14+EGFR  . CBC with Differential  . Lipid Panel     Laroy Apple, MD [facility] 11/07/2015, 8:16 AM

## 2015-11-07 NOTE — Addendum Note (Signed)
Addended by: Karle Plumber on: 11/07/2015 08:45 AM   Modules accepted: Miquel Dunn

## 2015-11-09 ENCOUNTER — Telehealth: Payer: Self-pay | Admitting: Family Medicine

## 2015-11-09 DIAGNOSIS — M79641 Pain in right hand: Secondary | ICD-10-CM

## 2015-11-09 NOTE — Telephone Encounter (Signed)
Spoke to pt and reviewed imaging and advised the next step is to do a referral to sports medicine. Will put referral in.

## 2016-02-07 ENCOUNTER — Other Ambulatory Visit: Payer: Self-pay | Admitting: Family Medicine

## 2016-03-10 ENCOUNTER — Encounter: Payer: Self-pay | Admitting: Family Medicine

## 2016-03-10 ENCOUNTER — Ambulatory Visit (INDEPENDENT_AMBULATORY_CARE_PROVIDER_SITE_OTHER): Payer: Managed Care, Other (non HMO)

## 2016-03-10 ENCOUNTER — Ambulatory Visit (INDEPENDENT_AMBULATORY_CARE_PROVIDER_SITE_OTHER): Payer: Managed Care, Other (non HMO) | Admitting: Family Medicine

## 2016-03-10 VITALS — BP 134/85 | HR 81 | Temp 96.6°F | Ht 68.0 in | Wt 183.4 lb

## 2016-03-10 DIAGNOSIS — I10 Essential (primary) hypertension: Secondary | ICD-10-CM

## 2016-03-10 DIAGNOSIS — M25521 Pain in right elbow: Secondary | ICD-10-CM

## 2016-03-10 DIAGNOSIS — E785 Hyperlipidemia, unspecified: Secondary | ICD-10-CM | POA: Diagnosis not present

## 2016-03-10 DIAGNOSIS — Z23 Encounter for immunization: Secondary | ICD-10-CM | POA: Diagnosis not present

## 2016-03-10 NOTE — Patient Instructions (Signed)
Great to see you!  Please call if you would like to see a specialist for your elbow, we'll call with labs and x ray results within 1 week.   Come back in 4 months

## 2016-03-10 NOTE — Progress Notes (Signed)
   HPI  Patient presents today here for follow-up of right hand pain, hypertension, hyperlipidemia.  Hyperlipidemia Tolerating Lipitor well, no side effects Not watching diet Occupationally active but no exercise regimen  Hypertension Checking blood pressure at home a few times, usually 209P systolic No chest pain, dyspnea, palpitations, leg edema.  Right elbow pain Lateral right elbow pain with almost any activity. He states this started several years ago after shooting his bow and arrow for several hours. Right hand pain has resolved.  Previously tried to wean off of meloxicam which she could not tolerate.   PMH: Smoking status noted ROS: Per HPI  Objective: BP 134/85   Pulse 81   Temp (!) 96.6 F (35.9 C) (Oral)   Ht _0  (1.727 m)   Wt 183 lb 6.4 oz (83.2 kg)   BMI 27.89 kg/m  Gen: NAD, alert, cooperative with exam HEENT: NCAT CV: RRR, good S1/S2, no murmur Resp: CTABL, no wheezes, non-labored Ext: No edema, warm Neuro: Alert and oriented, No gross deficits  MSK Full range of motion of right elbow, no gross deformity joint effusion, or erythema. No tenderness to palpation, no pain elicited with supination or pronation or flexion or extension.  Assessment and plan:  # Hyperlipidemia Rechecking lipids after starting Lipitor Repeat CMP as well  # Hypertension Well controlled on HCTZ plus amlodipine   # Right elbow pain OA versus chronic tendinitis Offered sports medicine referral Plain film today Back for referral if he decides to go   influenza vaccine given today, counseling provided for all vaccine components   Orders Placed This Encounter  Procedures  . DG Elbow Complete Right    Standing Status:   Future    Standing Expiration Date:   05/10/2017    Order Specific Question:   Reason for Exam (SYMPTOM  OR DIAGNOSIS REQUIRED)    Answer:   R elbow pain with activity for years, eval for OA    Order Specific Question:   Preferred imaging  location?    Answer:   Internal  . Lipid panel  . CMP14+EGFR  . CBC with [place], MD [facility] 03/10/2016, 8:15 AM

## 2016-03-11 LAB — CMP14+EGFR
A/G RATIO: 2.2 (ref 1.2–2.2)
ALBUMIN: 4.4 g/dL (ref 3.5–5.5)
ALK PHOS: 60 IU/L (ref 39–117)
ALT: 33 IU/L (ref 0–44)
AST: 31 IU/L (ref 0–40)
BUN / CREAT RATIO: 14 (ref 9–20)
BUN: 15 mg/dL (ref 6–24)
Bilirubin Total: 0.4 mg/dL (ref 0.0–1.2)
CALCIUM: 9 mg/dL (ref 8.7–10.2)
CO2: 25 mmol/L (ref 18–29)
CREATININE: 1.04 mg/dL (ref 0.76–1.27)
Chloride: 99 mmol/L (ref 96–106)
GFR calc Af Amer: 92 mL/min/{1.73_m2} (ref >59)
GFR, EST NON AFRICAN AMERICAN: 80 mL/min/{1.73_m2} (ref >59)
GLOBULIN, TOTAL: 2 g/dL (ref 1.5–4.5)
Glucose: 107 mg/dL — ABNORMAL HIGH (ref 65–99)
POTASSIUM: 3.9 mmol/L (ref 3.5–5.2)
SODIUM: 141 mmol/L (ref 134–144)
Total Protein: 6.4 g/dL (ref 6.0–8.5)

## 2016-03-11 LAB — LIPID PANEL
CHOLESTEROL TOTAL: 167 mg/dL (ref 100–199)
Chol/HDL Ratio: 2.6 ratio units (ref 0.0–5.0)
HDL: 64 mg/dL (ref >39)
LDL CALC: 85 mg/dL (ref 0–99)
TRIGLYCERIDES: 90 mg/dL (ref 0–149)
VLDL CHOLESTEROL CAL: 18 mg/dL (ref 5–40)

## 2016-03-11 LAB — CBC WITH DIFFERENTIAL/PLATELET
BASOS: 0 %
Basophils Absolute: 0 10*3/uL (ref 0.0–0.2)
EOS (ABSOLUTE): 0.2 10*3/uL (ref 0.0–0.4)
EOS: 2 %
HEMATOCRIT: 39.2 % (ref 37.5–51.0)
Hemoglobin: 13.4 g/dL (ref 12.6–17.7)
Immature Grans (Abs): 0 10*3/uL (ref 0.0–0.1)
Immature Granulocytes: 0 %
LYMPHS ABS: 2 10*3/uL (ref 0.7–3.1)
Lymphs: 22 %
MCH: 30.9 pg (ref 26.6–33.0)
MCHC: 34.2 g/dL (ref 31.5–35.7)
MCV: 90 fL (ref 79–97)
MONOS ABS: 0.8 10*3/uL (ref 0.1–0.9)
Monocytes: 9 %
Neutrophils Absolute: 6.2 10*3/uL (ref 1.4–7.0)
Neutrophils: 67 %
PLATELETS: 239 10*3/uL (ref 150–379)
RBC: 4.34 x10E6/uL (ref 4.14–5.80)
RDW: 13 % (ref 12.3–15.4)
WBC: 9.2 10*3/uL (ref 3.4–10.8)

## 2016-05-13 ENCOUNTER — Encounter: Payer: Self-pay | Admitting: Family Medicine

## 2016-05-13 ENCOUNTER — Ambulatory Visit (INDEPENDENT_AMBULATORY_CARE_PROVIDER_SITE_OTHER): Payer: Managed Care, Other (non HMO) | Admitting: Family Medicine

## 2016-05-13 VITALS — BP 140/88 | HR 91 | Temp 98.3°F | Ht 68.0 in | Wt 186.6 lb

## 2016-05-13 DIAGNOSIS — I10 Essential (primary) hypertension: Secondary | ICD-10-CM

## 2016-05-13 DIAGNOSIS — A084 Viral intestinal infection, unspecified: Secondary | ICD-10-CM | POA: Diagnosis not present

## 2016-05-13 DIAGNOSIS — R234 Changes in skin texture: Secondary | ICD-10-CM

## 2016-05-13 NOTE — Patient Instructions (Addendum)
Great to see you!  Get plenty of fluids.   Try neosporin and a bandaid over your knuckle to help with the fissure. Also consider trying Working Hands lotion, this may help you avoid getting them in general

## 2016-05-13 NOTE — Progress Notes (Signed)
   HPI  Patient presents today for follow-up of hypertension and with a couple of acute complaints.  Nausea and abdominal pain Patient states that over the last 1-2 days he's had nausea, multiple episodes of diarrhea, and crampy abdominal pain. His diarrhea appears to be improving today. His stomach "doesn't feel right still". He missed one day of work.  He denies any fevers, sweats, shortness of breath, cough, or chest pain. He did have some mild chills. He denies any body aches.  Hypertension Has been 120s and 30s at home Good medication compliance No chest pain.  PMH: Smoking status noted ROS: Per HPI  Objective: BP 140/88   Pulse 91   Temp 98.3 F (36.8 C) (Oral)   Ht 5\' 8"  (1.727 m)   Wt 186 lb 9.6 oz (84.6 kg)   BMI 28.37 kg/m  Gen: NAD, alert, cooperative with exam HEENT: NCAT CV: RRR, good S1/S2, no murmur Resp: CTABL, no wheezes, non-labored Abd: SNTND, BS present, no guarding or organomegaly Ext: No edema, warm Neuro: Alert and oriented, No gross deficits Skin: Left fourth finger with approximately 1 cm x 2 mm fissure on the PIP  Assessment and plan:  # Viral gastroenteritis Appears to be improving, discussed supportive care including fluids. Low threshold for return  # Essential hypertension Reasonably well controlled on current medications, no changes Labs are up-to-date  # Fissure of skin Signs of infection, discussed supportive care and usual course of healing with aggressive emollient and bandaging.   Laroy Apple, MD [facility] 05/13/2016, 8:26 AM

## 2016-06-20 ENCOUNTER — Other Ambulatory Visit: Payer: Self-pay | Admitting: Family Medicine

## 2016-08-11 ENCOUNTER — Ambulatory Visit (INDEPENDENT_AMBULATORY_CARE_PROVIDER_SITE_OTHER): Payer: Managed Care, Other (non HMO) | Admitting: Family Medicine

## 2016-08-11 ENCOUNTER — Encounter: Payer: Self-pay | Admitting: Family Medicine

## 2016-08-11 VITALS — BP 138/84 | HR 97 | Temp 98.0°F | Ht 68.0 in | Wt 187.2 lb

## 2016-08-11 DIAGNOSIS — I10 Essential (primary) hypertension: Secondary | ICD-10-CM

## 2016-08-11 DIAGNOSIS — M159 Polyosteoarthritis, unspecified: Secondary | ICD-10-CM

## 2016-08-11 DIAGNOSIS — F329 Major depressive disorder, single episode, unspecified: Secondary | ICD-10-CM | POA: Diagnosis not present

## 2016-08-11 DIAGNOSIS — F32A Depression, unspecified: Secondary | ICD-10-CM

## 2016-08-11 DIAGNOSIS — E785 Hyperlipidemia, unspecified: Secondary | ICD-10-CM

## 2016-08-11 MED ORDER — MELOXICAM 7.5 MG PO TABS: 7.5000 mg | ORAL_TABLET | Freq: Every day | ORAL | 1 refills | Status: DC

## 2016-08-11 MED ORDER — AMLODIPINE BESYLATE 5 MG PO TABS: 5.0000 mg | ORAL_TABLET | Freq: Every day | ORAL | 3 refills | Status: DC

## 2016-08-11 MED ORDER — ATORVASTATIN CALCIUM 20 MG PO TABS: 20.0000 mg | ORAL_TABLET | Freq: Every day | ORAL | 3 refills | Status: DC

## 2016-08-11 NOTE — Progress Notes (Signed)
   HPI  Patient presents today for follow-up chronic medical conditions.  Patient needs refills of amlodipine, Lipitor, meloxicam.  Meloxicam for arthritis pain, he uses sparingly, states he has not had this in several months. Okay with reduced dose.  Depression Patient has recently lost 55 of his friends. Denies suicidal thoughts. Medication helping, however struggling some recently.  Hypertension Good medication compliance. No chest pain.  Hyperlipidemia Good medication once, needs refill.  PMH: Smoking status noted ROS: Per HPI  Objective: BP 138/84   Pulse 97   Temp 98 F (36.7 C) (Oral)   Ht 5\' 8"  (1.727 m)   Wt 187 lb 3.2 oz (84.9 kg)   BMI 28.46 kg/m  Gen: NAD, alert, cooperative with exam HEENT: NCAT, EOMI, PERRL CV: RRR, good S1/S2, no murmur Resp: CTABL, no wheezes, non-labored Ext: No edema, warm Neuro: Alert and oriented, No gross deficits  Assessment and plan:  # Generalized osteoarthritis Refill meloxicam, reducing dose of discussed using sparingly.  # Depression Understandable difficulty lately, continue current medications, no SI  # Hypertension Reasonably well-controlled on current medications, no changes, refill moderate pain, continue HCTZ Annual labs in August  # Hyperlipidemia Previously very well controlled Lipitor, repeat labs in August. Continue Lipitor, clinically stable   Meds ordered this encounter  Medications  . meloxicam (MOBIC) 7.5 MG tablet    Sig: Take 1 tablet (7.5 mg total) by mouth daily.    Dispense:  90 tablet    Refill:  1    DC 7.5 mg dose, thanks!  Marland Kitchen atorvastatin (LIPITOR) 20 MG tablet    Sig: Take 1 tablet (20 mg total) by mouth daily.    Dispense:  90 tablet    Refill:  3  . amLODipine (NORVASC) 5 MG tablet    Sig: Take 1 tablet (5 mg total) by mouth daily.    Dispense:  90 tablet    Refill:  [place], MD [place] 08/11/2016, 8:20 AM

## 2016-08-11 NOTE — Patient Instructions (Signed)
Great to see you!  Lets follow up in 4 months or so, we will plan to do lab work at that time.

## 2016-10-31 ENCOUNTER — Other Ambulatory Visit: Payer: Self-pay | Admitting: Family Medicine

## 2016-11-24 ENCOUNTER — Other Ambulatory Visit: Payer: Self-pay | Admitting: Family Medicine

## 2016-11-24 NOTE — Telephone Encounter (Signed)
Last seen 08/11/16  Dr Wendi Snipes

## 2016-12-12 ENCOUNTER — Ambulatory Visit (INDEPENDENT_AMBULATORY_CARE_PROVIDER_SITE_OTHER): Payer: Managed Care, Other (non HMO) | Admitting: Family Medicine

## 2016-12-12 ENCOUNTER — Encounter: Payer: Self-pay | Admitting: Family Medicine

## 2016-12-12 VITALS — BP 132/83 | HR 85 | Temp 97.7°F | Ht 68.0 in | Wt 181.0 lb

## 2016-12-12 DIAGNOSIS — I10 Essential (primary) hypertension: Secondary | ICD-10-CM

## 2016-12-12 DIAGNOSIS — K219 Gastro-esophageal reflux disease without esophagitis: Secondary | ICD-10-CM | POA: Diagnosis not present

## 2016-12-12 DIAGNOSIS — F329 Major depressive disorder, single episode, unspecified: Secondary | ICD-10-CM | POA: Diagnosis not present

## 2016-12-12 DIAGNOSIS — F32A Depression, unspecified: Secondary | ICD-10-CM

## 2016-12-12 MED ORDER — BUPROPION HCL ER (XL) 450 MG PO TB24: 450.0000 mg | ORAL_TABLET | Freq: Every day | ORAL | 3 refills | Status: DC

## 2016-12-12 MED ORDER — HYDROCHLOROTHIAZIDE 25 MG PO TABS: 25.0000 mg | ORAL_TABLET | Freq: Every day | ORAL | 3 refills | Status: DC

## 2016-12-12 MED ORDER — PANTOPRAZOLE SODIUM 40 MG PO TBEC: 40.0000 mg | DELAYED_RELEASE_TABLET | Freq: Every day | ORAL | 3 refills | Status: DC

## 2016-12-12 NOTE — Progress Notes (Signed)
   HPI  Patient presents today here for follow up chronic medical conditions.  Hypertension Good medication compliance, no chest pain, dyspnea, palpitations, leg edema, headache.  Depression Doing well, energy okay No SI  Gerd Daily symptoms w/o meds, good compliance, well controlled   PMH: Smoking status noted ROS: Per HPI  Objective: BP 132/83   Pulse 85   Temp 97.7 F (36.5 C) (Oral)   Ht 5\' 8"  (1.727 m)   Wt 181 lb (82.1 kg)   BMI 27.52 kg/m  Gen: NAD, alert, cooperative with exam HEENT: NCAT CV: RRR, good S1/S2, no murmur Resp: CTABL, no wheezes, non-labored Ext: No edema, warm Neuro: Alert and oriented, No gross deficits  Depression screen [facility] 2/9 12/12/2016 08/11/2016 05/13/2016 03/10/2016 11/07/2015  Decreased Interest 0 2 1 0 3  Down, Depressed, Hopeless 0 2 1 0 1  PHQ - 2 Score 0 4 2 0 4  Altered sleeping - 0 0 - 0  Tired, decreased energy - 2 1 - 2  Change in appetite - 0 0 - 0  Feeling bad or failure about yourself  - 0 0 - 0  Trouble concentrating - 0 0 - 0  Moving slowly or fidgety/restless - 0 0 - 0  Suicidal thoughts - 0 0 - 0  PHQ-9 Score - 6 3 - 6  Difficult doing work/chores - - Not difficult at all - -     Assessment and plan:  # hypertension Well-controlled on amlodipine, HCTZ Contiue  Labs next visit   # Depression Doing well with wellbutrin, no changes  # GERD Well controlled Refill PPI   Meds ordered this encounter  Medications  . hydrochlorothiazide (HYDRODIURIL) 25 MG tablet    Sig: Take 1 tablet (25 mg total) by mouth daily.    Dispense:  90 tablet    Refill:  3  . BuPROPion HCl ER, XL, (FORFIVO XL) 450 MG TB24    Sig: Take 450 mg by mouth daily.    Dispense:  90 tablet    Refill:  3  . pantoprazole (PROTONIX) 40 MG tablet    Sig: Take 1 tablet (40 mg total) by mouth daily.    Dispense:  90 tablet    Refill:  [place], MD [place] 12/12/2016, 8:24 AM

## 2016-12-12 NOTE — Patient Instructions (Signed)
Great to see you!  Come back in 4 months, we will plan to do your labs then.

## 2017-02-23 ENCOUNTER — Encounter: Payer: Self-pay | Admitting: Family Medicine

## 2017-02-23 ENCOUNTER — Ambulatory Visit: Payer: Managed Care, Other (non HMO) | Admitting: Family Medicine

## 2017-02-23 VITALS — BP 118/75 | HR 106 | Temp 98.9°F | Ht 68.0 in | Wt 174.0 lb

## 2017-02-23 DIAGNOSIS — R52 Pain, unspecified: Secondary | ICD-10-CM

## 2017-02-23 DIAGNOSIS — A084 Viral intestinal infection, unspecified: Secondary | ICD-10-CM

## 2017-02-23 LAB — VERITOR FLU A/B WAIVED
INFLUENZA A: NEGATIVE
Influenza B: NEGATIVE

## 2017-02-23 MED ORDER — DIPHENOXYLATE-ATROPINE 2.5-0.025 MG PO TABS: ORAL_TABLET | ORAL | 0 refills | Status: DC

## 2017-02-23 MED ORDER — ONDANSETRON HCL 8 MG PO TABS: 8.0000 mg | ORAL_TABLET | Freq: Four times a day (QID) | ORAL | 0 refills | Status: DC

## 2017-02-23 NOTE — Progress Notes (Signed)
Subjective:  Patient ID: Alec Gutierrez, male    DOB: 05/01/1960  Age: 56 y.o. MRN: [id number]  CC: Chills (pt here today c/o chills and body aches)   HPI Alec Gutierrez presents for patient presents with dry cough, no congestion, and no runny stuffy nose. Diffuse headache of moderate intensity.  Has not been able to hold down anything has drank very few fluids and virtually no solids since onset.  Patient also has chills and subjective fever. Body ache all over.  Has sapped the energy to the point that of laying on the couch doing nothing other than ADLs. Onset 4 days ago.   Depression screen [facility] 2/9 02/23/2017 12/12/2016 08/11/2016  Decreased Interest 0 0 2  Down, Depressed, Hopeless 0 0 2  PHQ - 2 Score 0 0 4  Altered sleeping - - 0  Tired, decreased energy - - 2  Change in appetite - - 0  Feeling bad or failure about yourself  - - 0  Trouble concentrating - - 0  Moving slowly or fidgety/restless - - 0  Suicidal thoughts - - 0  PHQ-9 Score - - 6  Difficult doing work/chores - - -    History Abdelrahman has a past medical history of GERD (gastroesophageal reflux disease) and History of BPH.   He has a past surgical history that includes Hernia repair.   His family history includes Cancer in his mother; Heart disease in his father; Stroke in his father.He reports that he quit smoking about 21 months ago. His smoking use included cigarettes. He smoked 0.25 packs per day. he has never used smokeless tobacco. He reports that he drinks about 3.0 oz of alcohol per week. He reports that he does not use drugs.    ROS Review of Systems  Constitutional: Positive for activity change, appetite change, chills and fever.  HENT: Negative for congestion, ear discharge, ear pain, hearing loss, nosebleeds, postnasal drip, rhinorrhea, sinus pressure, sneezing and trouble swallowing.   Respiratory: Positive for cough. Negative for chest tightness and shortness of breath.   Cardiovascular: Negative for  chest pain and palpitations.  Gastrointestinal: Positive for diarrhea and nausea. Negative for abdominal pain.  Musculoskeletal: Positive for myalgias.  Skin: Negative for color change and rash.    Objective:  BP 118/75   Pulse (!) 106   Temp 98.9 F (37.2 C) (Oral)   Ht 5\' 8"  (1.727 m)   Wt 174 lb (78.9 kg)   BMI 26.46 kg/m   BP Readings from Last 3 Encounters:  02/23/17 118/75  12/12/16 132/83  08/11/16 138/84    Wt Readings from Last 3 Encounters:  02/23/17 174 lb (78.9 kg)  12/12/16 181 lb (82.1 kg)  08/11/16 187 lb 3.2 oz (84.9 kg)     Physical Exam  Constitutional: He is oriented to person, place, and time. He appears well-developed and well-nourished.  HENT:  Head: Normocephalic and atraumatic.  Right Ear: Tympanic membrane and external ear normal. No decreased hearing is noted.  Left Ear: Tympanic membrane and external ear normal. No decreased hearing is noted.  Nose: Mucosal edema present. Right sinus exhibits no frontal sinus tenderness. Left sinus exhibits no frontal sinus tenderness.  Mouth/Throat: No oropharyngeal exudate or posterior oropharyngeal erythema.  Eyes: EOM are normal. Pupils are equal, round, and reactive to light.  Neck: Normal range of motion. Neck supple. No Brudzinski's sign noted.  Cardiovascular: Normal rate and regular rhythm.  No murmur heard. Pulmonary/Chest: Breath sounds normal. No respiratory distress.  He has no wheezes. He has no rales.  Abdominal: Soft. There is no tenderness.  Lymphadenopathy:       Head (right side): No preauricular adenopathy present.       Head (left side): No preauricular adenopathy present.       Right cervical: No superficial cervical adenopathy present.      Left cervical: No superficial cervical adenopathy present.  Neurological: He is alert and oriented to person, place, and time.  Skin: Skin is warm and dry.  Vitals reviewed.     Assessment & Plan:   Kathy was seen today for  chills.  Diagnoses and all orders for this visit:  Body aches -     Veritor Flu A/B Waived  Viral gastroenteritis  Other orders -     ondansetron (ZOFRAN) 8 MG tablet; Take 1 tablet (8 mg total) 4 (four) times daily by mouth. -     diphenoxylate-atropine (LOMOTIL) 2.5-0.025 MG tablet; Take 1 to 2 tabs every 6 hours prn for diarrhea.       I am having Ayaan L. Mcmullan start on ondansetron and diphenoxylate-atropine. I am also having him maintain his meloxicam, atorvastatin, amLODipine, hydrochlorothiazide, BuPROPion HCl ER (XL), and pantoprazole.  Allergies as of 02/23/2017   No Known Allergies     Medication List        Accurate as of 02/23/17  9:30 AM. Always use your most recent med list.          amLODipine 5 MG tablet Commonly known as:  NORVASC Take 1 tablet (5 mg total) by mouth daily.   atorvastatin 20 MG tablet Commonly known as:  LIPITOR Take 1 tablet (20 mg total) by mouth daily.   BuPROPion HCl ER (XL) 450 MG Tb24 Commonly known as:  FORFIVO XL Take 450 mg by mouth daily.   diphenoxylate-atropine 2.5-0.025 MG tablet Commonly known as:  LOMOTIL Take 1 to 2 tabs every 6 hours prn for diarrhea.   hydrochlorothiazide 25 MG tablet Commonly known as:  HYDRODIURIL Take 1 tablet (25 mg total) by mouth daily.   meloxicam 7.5 MG tablet Commonly known as:  MOBIC Take 1 tablet (7.5 mg total) by mouth daily.   ondansetron 8 MG tablet Commonly known as:  ZOFRAN Take 1 tablet (8 mg total) 4 (four) times daily by mouth.   pantoprazole 40 MG tablet Commonly known as:  PROTONIX Take 1 tablet (40 mg total) by mouth daily.        Follow-up: Return if symptoms worsen or fail to improve.  Claretta Fraise, M.D.

## 2017-03-20 ENCOUNTER — Other Ambulatory Visit: Payer: Self-pay | Admitting: Family Medicine

## 2017-05-19 ENCOUNTER — Other Ambulatory Visit: Payer: Self-pay | Admitting: Family Medicine

## 2017-06-15 ENCOUNTER — Ambulatory Visit: Payer: Managed Care, Other (non HMO) | Admitting: Family Medicine

## 2017-06-15 ENCOUNTER — Encounter: Payer: Self-pay | Admitting: Family Medicine

## 2017-06-15 VITALS — BP 134/81 | HR 101 | Temp 98.1°F | Ht 68.0 in | Wt 185.2 lb

## 2017-06-15 DIAGNOSIS — F32A Depression, unspecified: Secondary | ICD-10-CM

## 2017-06-15 DIAGNOSIS — K219 Gastro-esophageal reflux disease without esophagitis: Secondary | ICD-10-CM | POA: Diagnosis not present

## 2017-06-15 DIAGNOSIS — M79641 Pain in right hand: Secondary | ICD-10-CM | POA: Diagnosis not present

## 2017-06-15 DIAGNOSIS — F329 Major depressive disorder, single episode, unspecified: Secondary | ICD-10-CM | POA: Diagnosis not present

## 2017-06-15 MED ORDER — MELOXICAM 15 MG PO TABS: 15.0000 mg | ORAL_TABLET | Freq: Every day | ORAL | 1 refills | Status: DC

## 2017-06-15 MED ORDER — BUPROPION HCL ER (XL) 450 MG PO TB24: 450.0000 mg | ORAL_TABLET | Freq: Every day | ORAL | 3 refills | Status: DC

## 2017-06-15 MED ORDER — RANITIDINE HCL 150 MG PO TABS: 150.0000 mg | ORAL_TABLET | Freq: Two times a day (BID) | ORAL | 3 refills | Status: DC

## 2017-06-15 NOTE — Patient Instructions (Signed)
Great to see you!  We have changed from Protonix to ranitidine 1 pill twice daily.  I have also increased her meloxicam.    Results for KALEP, FULL (MRN [id number]) as of 06/15/2017 08:15  Ref. Range 03/10/2016 08:18  Total CHOL/HDL Ratio Latest Ref Range: 0.0 - 5.0 ratio units 2.6  Cholesterol, Total Latest Ref Range: 100 - 199 mg/dL 167  HDL Cholesterol Latest Ref Range: >39 mg/dL 64  LDL (calc) Latest Ref Range: 0 - 99 mg/dL 85  Triglycerides Latest Ref Range: 0 - 149 mg/dL 90  VLDL Cholesterol Cal Latest Ref Range: 5 - 40 mg/dL 18

## 2017-06-15 NOTE — Progress Notes (Signed)
   HPI  Patient presents today here for routine follow-up.  Patient has had right hand pain for about a year to a year and a half.  Is mainly around the second MTP.  It hurts intermittently, not worse in the morning, not worse with exercise. Initially helped by meloxicam 50 mg but not as well by 7.5 mg.  GERD Would like to change from PPI to H2 blocker for insurance coverage reasons. Currently well controlled.  Depression Well controlled, needs refill.    PMH: Smoking status noted ROS: Per HPI  Objective: BP 134/81   Pulse (!) 101   Temp 98.1 F (36.7 C) (Oral)   Ht 5\' 8"  (1.727 m)   Wt 185 lb 3.2 oz (84 kg)   BMI 28.16 kg/m  Gen: NAD, alert, cooperative with exam HEENT: NCAT CV: RRR, good S1/S2, no murmur Resp: CTABL, no wheezes, non-labored Ext: No edema, warm Neuro: Alert and oriented, No gross deficits  Assessment and plan:  #GERD Changing from PPI to H2 blocker Controlled  #Right hand pain Escalate meloxicam back to 15 mg H2 blocker for PUD prophylaxis  #Depression Stable Continue Wellbutrin, refilled   Meds ordered this encounter  Medications  . buPROPion HCl ER, XL, (FORFIVO XL) 450 MG TB24    Sig: Take 450 mg by mouth daily.    Dispense:  90 tablet    Refill:  3  . meloxicam (MOBIC) 15 MG tablet    Sig: Take 1 tablet (15 mg total) by mouth daily.    Dispense:  90 tablet    Refill:  1  . ranitidine (ZANTAC) 150 MG tablet    Sig: Take 1 tablet (150 mg total) by mouth 2 (two) times daily.    Dispense:  180 tablet    Refill:  [place], MD [place] 06/15/2017, 8:18 AM

## 2017-08-31 ENCOUNTER — Ambulatory Visit (INDEPENDENT_AMBULATORY_CARE_PROVIDER_SITE_OTHER): Payer: Managed Care, Other (non HMO)

## 2017-08-31 ENCOUNTER — Encounter: Payer: Self-pay | Admitting: Family Medicine

## 2017-08-31 ENCOUNTER — Ambulatory Visit: Payer: Managed Care, Other (non HMO) | Admitting: Family Medicine

## 2017-08-31 VITALS — BP 122/72 | HR 86 | Temp 97.2°F | Ht 68.0 in | Wt 189.2 lb

## 2017-08-31 DIAGNOSIS — M19049 Primary osteoarthritis, unspecified hand: Secondary | ICD-10-CM

## 2017-08-31 DIAGNOSIS — S46212A Strain of muscle, fascia and tendon of other parts of biceps, left arm, initial encounter: Secondary | ICD-10-CM

## 2017-08-31 DIAGNOSIS — I10 Essential (primary) hypertension: Secondary | ICD-10-CM

## 2017-08-31 DIAGNOSIS — M79641 Pain in right hand: Secondary | ICD-10-CM

## 2017-08-31 DIAGNOSIS — S46211A Strain of muscle, fascia and tendon of other parts of biceps, right arm, initial encounter: Secondary | ICD-10-CM

## 2017-08-31 NOTE — Patient Instructions (Signed)
Great to see you!  Continue using your left arm, consider ice if it begins to ache persistently. I would recommend that you continue exercising using lower weights and higher repetition.  Come back to see Dr. Warrick Parisian or Dr. Lajuana Ripple in 4 months.

## 2017-08-31 NOTE — Progress Notes (Signed)
   HPI  Patient presents today for follow-up chronic medical conditions.  Hypertension Good medication compliance and tolerance. No leg swelling.  Hand arthritis Right hand seems to be coming more persistent over the right second MCP. Aloxi cam is moderately helpful.  Left arm pain. Patient has left arm pain with flexion against resistance around the antecubital fossa No injury. He is interested in working out more persistently and wondering if this will hurt it.  PMH: Smoking status noted ROS: Per HPI  Objective: BP 122/72   Pulse 86   Temp (!) 97.2 F (36.2 C) (Oral)   Ht _0  (1.727 m)   Wt 189 lb 3.2 oz (85.8 kg)   BMI 28.77 kg/m  Gen: NAD, alert, cooperative with exam HEENT: NCAT CV: RRR, good S1/S2, no murmur Resp: CTABL, no wheezes, non-labored Ext: No edema, warm Neuro: Alert and oriented, No gross deficits MSK:  No TTP L arm at distal biceps tendon inseration in the AC fossa R 2nd MCP with mild swelling, no erythema or ttp Strength 5/5 in left biceps muscle  Assessment and plan:  #Hypertension Well-controlled with HCTZ plus amlodipine No changes  #Hyperlipidemia Labs today, continue Lipitor  #Hand arthritis Pain at the second MCP, patient is using meloxicam with some benefit. Plain film today as it seems to be progressing Consider an arthritis  #Bicep strain Patient with tenderness of the left [facility] whenever he forcefully flexes Conservative therapy    Orders Placed This Encounter  Procedures  . DG Hand Complete Right    Standing Status:   Future    Standing Expiration Date:   11/01/2018    Order Specific Question:   Reason for Exam (SYMPTOM  OR DIAGNOSIS REQUIRED)    Answer:   R hand OA at 2 MCP    Order Specific Question:   Preferred imaging location?    Answer:   Internal    Order Specific Question:   Radiology Contrast Protocol - do NOT remove file path    Answer:   [email or web address]  . Lipid  panel  . CMP14+EGFR  . CBC with [place], MD [facility] 08/31/2017, 8:30 AM

## 2017-09-01 ENCOUNTER — Telehealth: Payer: Self-pay | Admitting: Family Medicine

## 2017-09-01 LAB — CBC WITH DIFFERENTIAL/PLATELET
BASOS ABS: 0 10*3/uL (ref 0.0–0.2)
Basos: 0 %
EOS (ABSOLUTE): 0.2 10*3/uL (ref 0.0–0.4)
Eos: 2 %
HEMATOCRIT: 40.9 % (ref 37.5–51.0)
HEMOGLOBIN: 13.3 g/dL (ref 13.0–17.7)
Immature Grans (Abs): 0 10*3/uL (ref 0.0–0.1)
Immature Granulocytes: 0 %
LYMPHS ABS: 2.1 10*3/uL (ref 0.7–3.1)
Lymphs: 26 %
MCH: 29.4 pg (ref 26.6–33.0)
MCHC: 32.5 g/dL (ref 31.5–35.7)
MCV: 90 fL (ref 79–97)
MONOS ABS: 0.8 10*3/uL (ref 0.1–0.9)
Monocytes: 9 %
NEUTROS ABS: 5.3 10*3/uL (ref 1.4–7.0)
Neutrophils: 63 %
Platelets: 296 10*3/uL (ref 150–379)
RBC: 4.53 x10E6/uL (ref 4.14–5.80)
RDW: 13.3 % (ref 12.3–15.4)
WBC: 8.4 10*3/uL (ref 3.4–10.8)

## 2017-09-01 LAB — CMP14+EGFR
A/G RATIO: 2.3 — AB (ref 1.2–2.2)
ALBUMIN: 4.3 g/dL (ref 3.5–5.5)
ALK PHOS: 57 IU/L (ref 39–117)
ALT: 18 IU/L (ref 0–44)
AST: 17 IU/L (ref 0–40)
BILIRUBIN TOTAL: 0.4 mg/dL (ref 0.0–1.2)
BUN / CREAT RATIO: 14 (ref 9–20)
BUN: 14 mg/dL (ref 6–24)
CHLORIDE: 103 mmol/L (ref 96–106)
CO2: 23 mmol/L (ref 20–29)
Calcium: 9.2 mg/dL (ref 8.7–10.2)
Creatinine, Ser: 0.99 mg/dL (ref 0.76–1.27)
GFR calc non Af Amer: 84 mL/min/{1.73_m2} (ref >59)
GFR, EST AFRICAN AMERICAN: 97 mL/min/{1.73_m2} (ref >59)
GLOBULIN, TOTAL: 1.9 g/dL (ref 1.5–4.5)
Glucose: 114 mg/dL — ABNORMAL HIGH (ref 65–99)
POTASSIUM: 3.9 mmol/L (ref 3.5–5.2)
SODIUM: 141 mmol/L (ref 134–144)
TOTAL PROTEIN: 6.2 g/dL (ref 6.0–8.5)

## 2017-09-01 LAB — LIPID PANEL
Chol/HDL Ratio: 3.2 ratio (ref 0.0–5.0)
Cholesterol, Total: 163 mg/dL (ref 100–199)
HDL: 51 mg/dL (ref >39)
LDL Calculated: 88 mg/dL (ref 0–99)
TRIGLYCERIDES: 121 mg/dL (ref 0–149)
VLDL CHOLESTEROL CAL: 24 mg/dL (ref 5–40)

## 2017-09-01 NOTE — Telephone Encounter (Signed)
-----   Message from Timmothy Euler, MD sent at 08/31/2017 11:42 AM EDT ----- Will you let him know that his x-ray shows no significant arthritis at the joint notes in concern.  I would be glad to refer him to hand specialist if he would like to see them. Thanks! sam

## 2017-09-01 NOTE — Telephone Encounter (Signed)
Aware of labs 

## 2017-09-01 NOTE — Telephone Encounter (Signed)
Pt notified of results Verbalizes understanding 

## 2017-09-03 LAB — SPECIMEN STATUS REPORT

## 2017-09-03 LAB — HGB A1C W/O EAG: HEMOGLOBIN A1C: 5.4 % (ref 4.8–5.6)

## 2017-10-05 ENCOUNTER — Other Ambulatory Visit: Payer: Self-pay | Admitting: Family Medicine

## 2017-10-13 NOTE — Progress Notes (Signed)
Subjective: CC: Stomach issues PCP: Janora Norlander, DO SMO:LMBEM L Cybulski is a 57 y.o. male presenting to clinic today for:  1.  Diarrhea Patient reports onset of nausea and diarrhea 4 days ago.  He reports he is having about 6 stools per day.  These are nonbloody.  He has been having intermittent nausea that seems to be exacerbated by food.  He has been tolerating fluids without difficulty.  Denies any abdominal pain but did have some abdominal cramping that was not sustained a couple of days ago.  No consumption of undercooked foods, foods left out too long or untreated water.  No known sick contacts.  No recent travel.  Symptoms have not worsened but are just not getting better.  He is using over-the-counter anti-diarrheal agent with little improvement in symptoms.   ROS: Per HPI  No Known Allergies Past Medical History:  Diagnosis Date  . GERD (gastroesophageal reflux disease)   . History of BPH     Current Outpatient Medications:  .  amLODipine (NORVASC) 5 MG tablet, TAKE ONE TABLET BY MOUTH DAILY, Disp: 90 tablet, Rfl: 1 .  atorvastatin (LIPITOR) 20 MG tablet, TAKE ONE TABLET BY MOUTH DAILY, Disp: 90 tablet, Rfl: 1 .  buPROPion HCl ER, XL, (FORFIVO XL) 450 MG TB24, Take 450 mg by mouth daily., Disp: 90 tablet, Rfl: 3 .  hydrochlorothiazide (HYDRODIURIL) 25 MG tablet, Take 1 tablet (25 mg total) by mouth daily., Disp: 90 tablet, Rfl: 3 .  meloxicam (MOBIC) 15 MG tablet, Take 1 tablet (15 mg total) by mouth daily., Disp: 90 tablet, Rfl: 1 .  pantoprazole (PROTONIX) 40 MG tablet, Take 40 mg by mouth daily., Disp: , Rfl:  Social History   Socioeconomic History  . Marital status: Divorced    Spouse name: Not on file  . Number of children: Not on file  . Years of education: Not on file  . Highest education level: Not on file  Occupational History  . Not on file  Social Needs  . Financial resource strain: Not on file  . Food insecurity:    Worry: Not on file   Inability: Not on file  . Transportation needs:    Medical: Not on file    Non-medical: Not on file  Tobacco Use  . Smoking status: Former Smoker    Packs/day: 0.25    Types: Cigarettes    Last attempt to quit: 05/15/2015    Years since quitting: 2.4  . Smokeless tobacco: Never Used  Substance and Sexual Activity  . Alcohol use: Yes    Alcohol/week: 3.0 oz    Types: 5 Standard drinks or equivalent per week  . Drug use: No  . Sexual activity: Not on file  Lifestyle  . Physical activity:    Days per week: Not on file    Minutes per session: Not on file  . Stress: Not on file  Relationships  . Social connections:    Talks on phone: Not on file    Gets together: Not on file    Attends religious service: Not on file    Active member of club or organization: Not on file    Attends meetings of clubs or organizations: Not on file    Relationship status: Not on file  . Intimate partner violence:    Fear of current or ex partner: Not on file    Emotionally abused: Not on file    Physically abused: Not on file    Forced sexual  activity: Not on file  Other Topics Concern  . Not on file  Social History Narrative  . Not on file   Family History  Problem Relation Age of Onset  . Cancer Mother   . Heart disease Father   . Stroke Father     Objective: Office vital signs reviewed. BP 123/80   Pulse 89   Temp 98.4 F (36.9 C) (Oral)   Ht 5\' 8"  (1.727 m)   Wt 187 lb (84.8 kg)   BMI 28.43 kg/m   Physical Examination:  General: Awake, alert, well nourished, nontoxic. No acute distress HEENT: Normal    Eyes: PERRLA, extraocular membranes intact, sclera white    Throat: moist mucus membranes GI: obese, soft, non-tender, non-distended, bowel sounds present x4, no hepatomegaly, no splenomegaly, no masses   Assessment/ Plan: 57 y.o. male   1. Viral gastroenteritis Patient is afebrile and nontoxic-appearing.  No evidence of dehydration.  His abdominal exam is unremarkable.  I  suspect a viral gastroenteritis.  I have prescribed him Zofran ODT to use every 8 hours as needed nausea.  Push oral fluids.  We will check a BMP given associated sensation of fatigue and weakness.  AVS reviewed with instructions.  Home care instructions are reviewed.  Reasons for return evaluation discussed.  Reason for emergent evaluation the emergency department also reviewed.  Patient voiced good understanding and will follow-up as needed.  Work note provided  2. Weakness - Basic Metabolic Panel    Meds ordered this encounter  Medications  . ondansetron (ZOFRAN ODT) 4 MG disintegrating tablet    Sig: Take 1 tablet (4 mg total) by mouth every 8 (eight) hours as needed for nausea or vomiting.    Dispense:  20 tablet    Refill:  [place], DO [place] [phone]

## 2017-10-14 ENCOUNTER — Ambulatory Visit: Payer: Managed Care, Other (non HMO) | Admitting: Family Medicine

## 2017-10-14 ENCOUNTER — Encounter: Payer: Self-pay | Admitting: Family Medicine

## 2017-10-14 VITALS — BP 123/80 | HR 89 | Temp 98.4°F | Ht 68.0 in | Wt 187.0 lb

## 2017-10-14 DIAGNOSIS — A084 Viral intestinal infection, unspecified: Secondary | ICD-10-CM | POA: Diagnosis not present

## 2017-10-14 DIAGNOSIS — R531 Weakness: Secondary | ICD-10-CM | POA: Diagnosis not present

## 2017-10-14 MED ORDER — ONDANSETRON 4 MG PO TBDP: 4.0000 mg | ORAL_TABLET | Freq: Three times a day (TID) | ORAL | 0 refills | Status: DC | PRN

## 2017-10-14 NOTE — Patient Instructions (Signed)
Keep drinking plenty of fluids.  Consider adding Gatorade to help with electrolytes.  I am checking your electrolytes today through your blood and I will contact you tomorrow to let you know if there is any imbalance that needs replacement.  I have prescribed you Zofran to use every 8 hours if needed for nausea.  If you are unable to stay hydrated, develop fever, severe abdominal pain, blood in your stool, please seek immediate medical attention.   Food Choices to Help Relieve Diarrhea, Adult When you have diarrhea, the foods you eat and your eating habits are very important. Choosing the right foods and drinks can help:  Relieve diarrhea.  Replace lost fluids and nutrients.  Prevent dehydration.  What general guidelines should I follow? Relieving diarrhea  Choose foods with less than 2 g or .07 oz. of fiber per serving.  Limit fats to less than 8 tsp (38 g or 1.34 oz.) a day.  Avoid the following: ? Foods and beverages sweetened with high-fructose corn syrup, honey, or sugar alcohols such as xylitol, sorbitol, and mannitol. ? Foods that contain a lot of fat or sugar. ? Fried, greasy, or spicy foods. ? High-fiber grains, breads, and cereals. ? Raw fruits and vegetables.  Eat foods that are rich in probiotics. These foods include dairy products such as yogurt and fermented milk products. They help increase healthy bacteria in the stomach and intestines (gastrointestinal tract, or GI tract).  If you have lactose intolerance, avoid dairy products. These may make your diarrhea worse.  Take medicine to help stop diarrhea (antidiarrheal medicine) only as told by your health care provider. Replacing nutrients  Eat small meals or snacks every 3-4 hours.  Eat bland foods, such as white rice, toast, or baked potato, until your diarrhea starts to get better. Gradually reintroduce nutrient-rich foods as tolerated or as told by your health care provider. This includes: ? Well-cooked protein  foods. ? Peeled, seeded, and soft-cooked fruits and vegetables. ? Low-fat dairy products.  Take vitamin and mineral supplements as told by your health care provider. Preventing dehydration   Start by sipping water or a special solution to prevent dehydration (oral rehydration solution, ORS). Urine that is clear or pale yellow means that you are getting enough fluid.  Try to drink at least 8-10 cups of fluid each day to help replace lost fluids.  You may add other liquids in addition to water, such as clear juice or decaffeinated sports drinks, as tolerated or as told by your health care provider.  Avoid drinks with caffeine, such as coffee, tea, or soft drinks.  Avoid alcohol. What foods are recommended? The items listed may not be a complete list. Talk with your health care provider about what dietary choices are best for you. Grains White rice. White, [place], or pita breads (fresh or toasted), including plain rolls, buns, or bagels. White pasta. Saltine, soda, or graham crackers. Pretzels. Low-fiber cereal. Cooked cereals made with water (such as cornmeal, farina, or cream cereals). Plain muffins. Matzo. Melba toast. Zwieback. Vegetables Potatoes (without the skin). Most well-cooked and canned vegetables without skins or seeds. Tender lettuce. Fruits Apple sauce. Fruits canned in juice. Cooked apricots, cherries, grapefruit, peaches, pears, or plums. Fresh bananas and cantaloupe. Meats and other protein foods Baked or boiled chicken. Eggs. Tofu. Fish. Seafood. Smooth nut butters. Ground or well-cooked tender beef, ham, veal, lamb, pork, or poultry. Dairy Plain yogurt, kefir, and unsweetened liquid yogurt. Lactose-free milk, buttermilk, skim milk, or soy milk. Low-fat or nonfat  hard cheese. Beverages Water. Low-calorie sports drinks. Fruit juices without pulp. Strained tomato and vegetable juices. Decaffeinated teas. Sugar-free beverages not sweetened with sugar alcohols. Oral  rehydration solutions, if approved by your health care provider. Seasoning and other foods Bouillon, broth, or soups made from recommended foods. What foods are not recommended? The items listed may not be a complete list. Talk with your health care provider about what dietary choices are best for you. Grains Whole grain, whole wheat, bran, or rye breads, rolls, pastas, and crackers. Wild or brown rice. Whole grain or bran cereals. Barley. Oats and oatmeal. Corn tortillas or taco shells. Granola. Popcorn. Vegetables Raw vegetables. Fried vegetables. Cabbage, broccoli, Brussels sprouts, artichokes, baked beans, beet greens, corn, kale, legumes, peas, sweet potatoes, and yams. Potato skins. Cooked spinach and cabbage. Fruits Dried fruit, including raisins and dates. Raw fruits. Stewed or dried prunes. Canned fruits with syrup. Meat and other protein foods Fried or fatty meats. Deli meats. Chunky nut butters. Nuts and seeds. Beans and lentils. Berniece Salines. Hot dogs. Sausage. Dairy High-fat cheeses. Whole milk, chocolate milk, and beverages made with milk, such as milk shakes. Half-and-half. Cream. sour cream. Ice cream. Beverages Caffeinated beverages (such as coffee, tea, soda, or energy drinks). Alcoholic beverages. Fruit juices with pulp. Prune juice. Soft drinks sweetened with high-fructose corn syrup or sugar alcohols. High-calorie sports drinks. Fats and oils Butter. Cream sauces. Margarine. Salad oils. Plain salad dressings. Olives. Avocados. Mayonnaise. Sweets and desserts Sweet rolls, doughnuts, and sweet breads. Sugar-free desserts sweetened with sugar alcohols such as xylitol and sorbitol. Seasoning and other foods Honey. Hot sauce. Chili powder. Gravy. Cream-based or milk-based soups. Pancakes and waffles. Summary  When you have diarrhea, the foods you eat and your eating habits are very important.  Make sure you get at least 8-10 cups of fluid each day, or enough to keep your urine  clear or pale yellow.  Eat bland foods and gradually reintroduce healthy, nutrient-rich foods as tolerated, or as told by your health care provider.  Avoid high-fiber, fried, greasy, or spicy foods. This information is not intended to replace advice given to you by your health care provider. Make sure you discuss any questions you have with your health care provider. Document Released: 06/28/2003 Document Revised: 04/04/2016 Document Reviewed: 04/04/2016 Elsevier Interactive Patient Education  [employer].

## 2017-10-15 LAB — BASIC METABOLIC PANEL
BUN/Creatinine Ratio: 16 (ref 9–20)
BUN: 15 mg/dL (ref 6–24)
CALCIUM: 9 mg/dL (ref 8.7–10.2)
CO2: 24 mmol/L (ref 20–29)
CREATININE: 0.91 mg/dL (ref 0.76–1.27)
Chloride: 105 mmol/L (ref 96–106)
GFR calc Af Amer: 108 mL/min/{1.73_m2} (ref >59)
GFR, EST NON AFRICAN AMERICAN: 93 mL/min/{1.73_m2} (ref >59)
Glucose: 94 mg/dL (ref 65–99)
Potassium: 4.3 mmol/L (ref 3.5–5.2)
Sodium: 144 mmol/L (ref 134–144)

## 2017-12-16 ENCOUNTER — Ambulatory Visit: Payer: Managed Care, Other (non HMO) | Admitting: Family Medicine

## 2018-01-01 ENCOUNTER — Other Ambulatory Visit: Payer: Self-pay

## 2018-01-01 MED ORDER — PANTOPRAZOLE SODIUM 40 MG PO TBEC: 40.0000 mg | DELAYED_RELEASE_TABLET | Freq: Every day | ORAL | 0 refills | Status: DC

## 2018-01-01 MED ORDER — HYDROCHLOROTHIAZIDE 25 MG PO TABS: 25.0000 mg | ORAL_TABLET | Freq: Every day | ORAL | 0 refills | Status: DC

## 2018-02-15 ENCOUNTER — Encounter: Payer: Self-pay | Admitting: Gastroenterology

## 2018-03-02 ENCOUNTER — Encounter: Payer: Self-pay | Admitting: Family Medicine

## 2018-03-02 ENCOUNTER — Ambulatory Visit: Payer: Managed Care, Other (non HMO) | Admitting: Family Medicine

## 2018-03-02 VITALS — BP 132/80 | HR 99 | Temp 98.1°F | Ht 68.0 in | Wt 196.0 lb

## 2018-03-02 DIAGNOSIS — E7800 Pure hypercholesterolemia, unspecified: Secondary | ICD-10-CM

## 2018-03-02 DIAGNOSIS — F329 Major depressive disorder, single episode, unspecified: Secondary | ICD-10-CM | POA: Diagnosis not present

## 2018-03-02 DIAGNOSIS — K219 Gastro-esophageal reflux disease without esophagitis: Secondary | ICD-10-CM

## 2018-03-02 DIAGNOSIS — I10 Essential (primary) hypertension: Secondary | ICD-10-CM | POA: Diagnosis not present

## 2018-03-02 MED ORDER — ATORVASTATIN CALCIUM 20 MG PO TABS
20.0000 mg | ORAL_TABLET | Freq: Every day | ORAL | 3 refills | Status: DC
Start: 2018-03-02 — End: 2019-06-10

## 2018-03-02 MED ORDER — HYDROCHLOROTHIAZIDE 25 MG PO TABS: 25.0000 mg | ORAL_TABLET | Freq: Every day | ORAL | 3 refills | Status: DC

## 2018-03-02 MED ORDER — AMLODIPINE BESYLATE 5 MG PO TABS: 5.0000 mg | ORAL_TABLET | Freq: Every day | ORAL | 3 refills | Status: DC

## 2018-03-02 MED ORDER — ESCITALOPRAM OXALATE 10 MG PO TABS: 10.0000 mg | ORAL_TABLET | Freq: Every day | ORAL | 0 refills | Status: DC

## 2018-03-02 MED ORDER — PANTOPRAZOLE SODIUM 40 MG PO TBEC: 40.0000 mg | DELAYED_RELEASE_TABLET | Freq: Every day | ORAL | 3 refills | Status: DC

## 2018-03-02 NOTE — Patient Instructions (Signed)
We have started Lexapro today for your symptoms of depression.  Continue the buproprion for now, we may consider tapering off of medication at some point if you respond well to the Lexapro.  See me in 6-8 weeks for recheck.  Taking the medicine as directed and not missing any doses is one of the best things you can do to treat your depression.  Here are some things to keep in mind:  1) Side effects (stomach upset, some increased anxiety) may happen before you notice a benefit.  These side effects typically go away over time. 2) Changes to your dose of medicine or a change in medication all together is sometimes necessary 3) Most people need to be on medication at least 12 months 4) Many people will notice an improvement within two weeks but the full effect of the medication can take up to 4-6 weeks 5) Stopping the medication when you start feeling better often results in a return of symptoms 6) Never discontinue your medication without contacting a health care professional first.  Some medications require gradual discontinuation/ taper and can make you sick if you stop them abruptly.  If your symptoms worsen or you have thoughts of suicide/homicide, PLEASE SEEK IMMEDIATE MEDICAL ATTENTION.  You may always call:  National Suicide Hotline: (469) 095-0949 [place]: (630)680-3473 Crisis Recovery in [place]: 217-780-7512   These are available 24 hours a day, 7 days a week.

## 2018-03-02 NOTE — Progress Notes (Signed)
Subjective: CC: HTN, HLD, GERD PCP: Janora Norlander, DO CVE:LFYBO Alec Gutierrez is a 57 y.o. male presenting to clinic today for:  1. HTN and HLD Patient reports that he is missed the last 3 days of his blood pressure medication, because he "forgot because it was a week and".  He denies any chest pain, lower extremity edema, dizziness or visual disturbance.  He does note occasional shortness of breath with activity that is intermittent and not sustained.  He needs refills on all of his medications.  2.  Depression Patient reports that he "failed a recent depression screening".  He reports use of the Wellbutrin ER for 150 mg but states that he occasionally forgets to take this as well.  He is not sure that it is helping.  He has never been on any other antidepressants.  He does not "feel depressed".  But he does state that a lot of the check marks on the screening applied to him including loss of interest in normal activities.  No SI, HI, visual auditory hallucinations.  3.  GERD Patient reports longstanding history of acid reflux that is controlled with Protonix.  Denies any hematochezia, melena, nausea, vomiting or intolerance to p.o. intake.  No history of ulcers or GI bleed.   ROS: Per HPI  No Known Allergies Past Medical History:  Diagnosis Date  . GERD (gastroesophageal reflux disease)   . History of BPH     Current Outpatient Medications:  .  amLODipine (NORVASC) 5 MG tablet, TAKE ONE TABLET BY MOUTH DAILY, Disp: 90 tablet, Rfl: 1 .  atorvastatin (LIPITOR) 20 MG tablet, TAKE ONE TABLET BY MOUTH DAILY, Disp: 90 tablet, Rfl: 1 .  buPROPion HCl ER, XL, (FORFIVO XL) 450 MG TB24, Take 450 mg by mouth daily., Disp: 90 tablet, Rfl: 3 .  hydrochlorothiazide (HYDRODIURIL) 25 MG tablet, Take 1 tablet (25 mg total) by mouth daily., Disp: 90 tablet, Rfl: 0 .  meloxicam (MOBIC) 15 MG tablet, Take 1 tablet (15 mg total) by mouth daily., Disp: 90 tablet, Rfl: 1 .  pantoprazole (PROTONIX) 40  MG tablet, Take 1 tablet (40 mg total) by mouth daily., Disp: 90 tablet, Rfl: 0 Social History   Socioeconomic History  . Marital status: Divorced    Spouse name: Not on file  . Number of children: Not on file  . Years of education: Not on file  . Highest education level: Not on file  Occupational History  . Not on file  Social Needs  . Financial resource strain: Not on file  . Food insecurity:    Worry: Not on file    Inability: Not on file  . Transportation needs:    Medical: Not on file    Non-medical: Not on file  Tobacco Use  . Smoking status: Former Smoker    Packs/day: 0.25    Types: Cigarettes    Last attempt to quit: 05/15/2015    Years since quitting: 2.8  . Smokeless tobacco: Never Used  Substance and Sexual Activity  . Alcohol use: Yes    Alcohol/week: 5.0 standard drinks    Types: 5 Standard drinks or equivalent per week  . Drug use: No  . Sexual activity: Not on file  Lifestyle  . Physical activity:    Days per week: Not on file    Minutes per session: Not on file  . Stress: Not on file  Relationships  . Social connections:    Talks on phone: Not on file  Gets together: Not on file    Attends religious service: Not on file    Active member of club or organization: Not on file    Attends meetings of clubs or organizations: Not on file    Relationship status: Not on file  . Intimate partner violence:    Fear of current or ex partner: Not on file    Emotionally abused: Not on file    Physically abused: Not on file    Forced sexual activity: Not on file  Other Topics Concern  . Not on file  Social History Narrative  . Not on file   Family History  Problem Relation Age of Onset  . Cancer Mother   . Heart disease Father   . Stroke Father     Objective: Office vital signs reviewed. BP 132/80   Pulse 99   Temp 98.1 F (36.7 C) (Oral)   Ht 5\' 8"  (1.727 m)   Wt 196 lb (88.9 kg)   BMI 29.80 kg/m   Physical Examination:  General: Awake,  alert, well nourished, No acute distress HEENT: Normal, sclera white, MMM Cardio: regular rate and rhythm, S1S2 heard, no murmurs appreciated Pulm: clear to auscultation bilaterally, no wheezes, rhonchi or rales; normal work of breathing on room air Extremities: warm, well perfused, No edema, cyanosis or clubbing; +2 pulses bilaterally Psych: Mood stable, speech normal, affect appropriate, pleasant and interactive. Depression screen [facility] 2/9 03/02/2018 10/14/2017 08/31/2017  Decreased Interest 3 2 0  Down, Depressed, Hopeless 1 1 0  PHQ - 2 Score 4 3 0  Altered sleeping - 3 -  Tired, decreased energy 0 2 -  Change in appetite 0 1 -  Feeling bad or failure about yourself  0 0 -  Trouble concentrating 0 0 -  Moving slowly or fidgety/restless 0 1 -  Suicidal thoughts 0 0 -  PHQ-9 Score - 10 -  Difficult doing work/chores Somewhat difficult - -  Some recent data might be hidden    Assessment/ Plan: 57 y.o. male   1. Essential hypertension Borderline.  Initially elevated but with manual recheck was within normal limits.  I advised him to place his medications next his toothbrush that he remembers to take them every day.  He will follow-up in 6 to 8 weeks for depression as below at which time we will follow-up on blood pressure as well.  2. Gastroesophageal reflux disease, esophagitis presence not specified Controlled with Protonix.  This has been refilled.  3. Pure hypercholesterolemia Due for fasting lipid profile in May 2020.  Continue statin.  4. Reactive depression Not controlled with Wellbutrin.  We discussed adding SSRI.  Upon further review, I do see that he is been prescribed Lexapro in the past.  I have prescribed this at 10 mg.  Will err on the side of caution given intermittent use of Mobic for osteoarthritis.  May need to increase to 20 mg at next visit.  We discussed that we will plan on titrating Wellbutrin down if he is found it to be ineffective as we titrate him on 2 SSRI  if he desires this.  National suicide hotline provided.  AVS provided and reviewed.  Follow-up as directed in 6 to 8 weeks for recheck.   No orders of the defined types were placed in this encounter.  Meds ordered this encounter  Medications  . escitalopram (LEXAPRO) 10 MG tablet    Sig: Take 1 tablet (10 mg total) by mouth daily.    Dispense:  90 tablet    Refill:  0  . amLODipine (NORVASC) 5 MG tablet    Sig: Take 1 tablet (5 mg total) by mouth daily.    Dispense:  90 tablet    Refill:  3  . atorvastatin (LIPITOR) 20 MG tablet    Sig: Take 1 tablet (20 mg total) by mouth daily.    Dispense:  90 tablet    Refill:  3  . hydrochlorothiazide (HYDRODIURIL) 25 MG tablet    Sig: Take 1 tablet (25 mg total) by mouth daily.    Dispense:  90 tablet    Refill:  3  . pantoprazole (PROTONIX) 40 MG tablet    Sig: Take 1 tablet (40 mg total) by mouth daily.    Dispense:  90 tablet    Refill:  [place], [place] [phone]

## 2018-03-04 ENCOUNTER — Ambulatory Visit: Payer: Managed Care, Other (non HMO) | Admitting: Family Medicine

## 2018-03-15 ENCOUNTER — Telehealth: Payer: Self-pay | Admitting: Family Medicine

## 2018-03-15 ENCOUNTER — Other Ambulatory Visit: Payer: Self-pay | Admitting: Family Medicine

## 2018-03-15 MED ORDER — SERTRALINE HCL 50 MG PO TABS: 50.0000 mg | ORAL_TABLET | Freq: Every day | ORAL | 1 refills | Status: DC

## 2018-03-15 NOTE — Telephone Encounter (Signed)
SSRI should cause diarrhea if anything but I will replace Lexapro with Zoloft 50mg  daily.  Follow up if still having problems.

## 2018-03-15 NOTE — Telephone Encounter (Signed)
Pt states since he started taking the Lexapro he has been constipated and he had to leave work due to stomach hurting. He states he stopped taking the Lexapro this past Friday. Is there anything else he could try?

## 2018-03-15 NOTE — Telephone Encounter (Signed)
Pt has called saying that he has tried to take the escitalopram (LEXAPRO) 10 MG tablet states he can't take it, he is not able to go to the bathroom he has had to leave work because of it.

## 2018-03-23 NOTE — Telephone Encounter (Signed)
Attempts to contact pr have been made without return call in over 3 days, pt has appt 05/03/18 with Dr Lajuana Ripple, will close encounter.

## 2018-05-03 ENCOUNTER — Ambulatory Visit: Payer: Managed Care, Other (non HMO) | Admitting: Family Medicine

## 2018-05-03 VITALS — BP 134/83 | HR 108 | Temp 97.9°F | Ht 68.0 in | Wt 187.0 lb

## 2018-05-03 DIAGNOSIS — K5900 Constipation, unspecified: Secondary | ICD-10-CM | POA: Diagnosis not present

## 2018-05-03 DIAGNOSIS — F329 Major depressive disorder, single episode, unspecified: Secondary | ICD-10-CM | POA: Diagnosis not present

## 2018-05-03 DIAGNOSIS — K219 Gastro-esophageal reflux disease without esophagitis: Secondary | ICD-10-CM

## 2018-05-03 MED ORDER — SERTRALINE HCL 50 MG PO TABS: ORAL_TABLET | ORAL | 0 refills | Status: DC

## 2018-05-03 MED ORDER — OMEPRAZOLE 20 MG PO CPDR: 20.0000 mg | DELAYED_RELEASE_CAPSULE | Freq: Every day | ORAL | 3 refills | Status: DC

## 2018-05-03 NOTE — Progress Notes (Signed)
Subjective: CC: Depression,GERD PCP: Janora Norlander, DO MOQ:HUTML L Petrenko is a 58 y.o. male presenting to clinic today for:  1. Depression At last visit, patient was exhibiting symptoms suggestive of uncontrolled depression that he identified as losing interest in normal activities that previously brought him joy.  He had been on Wellbutrin XL 450 mg for some time.  We added Zoloft 50 mg daily and he is here today for to follow-up.  He thought that the Lexapro caused constipation and therefore called to have it switched over.  He was switched over to Zoloft.  Neither of the medications seem to help with his symptoms.  He continues to have symptoms of losing interest in normal activities.  Energy continues to be low.  2.  GERD/constipation Patient reports good control of acid reflux symptoms with pantoprazole but states his insurance is no longer covering the medicine and wishes to pursue an alternative.  He is tried other medicines like ranitidine in the past but this did not help control acid reflux symptoms.  He reports appetite is baseline.  No nausea, vomiting or belly pains.  He does report constipation alternating with diarrhea.  No hematochezia or melena.  He reports good intake of fluids.  No changes in diet.  He has not tried any medications to help with constipation yet.  He reports regular frequency stools but they are hard.  History significant for bleeding hemorrhoid in the past.  ROS: Per HPI  No Known Allergies Past Medical History:  Diagnosis Date  . GERD (gastroesophageal reflux disease)   . History of BPH     Current Outpatient Medications:  .  amLODipine (NORVASC) 5 MG tablet, Take 1 tablet (5 mg total) by mouth daily., Disp: 90 tablet, Rfl: 3 .  atorvastatin (LIPITOR) 20 MG tablet, Take 1 tablet (20 mg total) by mouth daily., Disp: 90 tablet, Rfl: 3 .  buPROPion HCl ER, XL, (FORFIVO XL) 450 MG TB24, Take 450 mg by mouth daily., Disp: 90 tablet, Rfl: 3 .   hydrochlorothiazide (HYDRODIURIL) 25 MG tablet, Take 1 tablet (25 mg total) by mouth daily., Disp: 90 tablet, Rfl: 3 .  meloxicam (MOBIC) 15 MG tablet, Take 1 tablet (15 mg total) by mouth daily., Disp: 90 tablet, Rfl: 1 .  pantoprazole (PROTONIX) 40 MG tablet, Take 1 tablet (40 mg total) by mouth daily., Disp: 90 tablet, Rfl: 3 .  sertraline (ZOLOFT) 50 MG tablet, Take 1 tablet (50 mg total) by mouth daily., Disp: 30 tablet, Rfl: 1 Social History   Socioeconomic History  . Marital status: Divorced    Spouse name: Not on file  . Number of children: Not on file  . Years of education: Not on file  . Highest education level: Not on file  Occupational History  . Not on file  Social Needs  . Financial resource strain: Not on file  . Food insecurity:    Worry: Not on file    Inability: Not on file  . Transportation needs:    Medical: Not on file    Non-medical: Not on file  Tobacco Use  . Smoking status: Former Smoker    Packs/day: 0.25    Types: Cigarettes    Last attempt to quit: 05/15/2015    Years since quitting: 2.9  . Smokeless tobacco: Never Used  Substance and Sexual Activity  . Alcohol use: Yes    Alcohol/week: 5.0 standard drinks    Types: 5 Standard drinks or equivalent per week  . Drug  use: No  . Sexual activity: Not on file  Lifestyle  . Physical activity:    Days per week: Not on file    Minutes per session: Not on file  . Stress: Not on file  Relationships  . Social connections:    Talks on phone: Not on file    Gets together: Not on file    Attends religious service: Not on file    Active member of club or organization: Not on file    Attends meetings of clubs or organizations: Not on file    Relationship status: Not on file  . Intimate partner violence:    Fear of current or ex partner: Not on file    Emotionally abused: Not on file    Physically abused: Not on file    Forced sexual activity: Not on file  Other Topics Concern  . Not on file  Social  History Narrative  . Not on file   Family History  Problem Relation Age of Onset  . Cancer Mother   . Heart disease Father   . Stroke Father     Objective: Office vital signs reviewed. BP 134/83   Pulse (!) 108   Temp 97.9 F (36.6 C) (Oral)   Ht 5\' 8"  (1.727 m)   Wt 187 lb (84.8 kg)   BMI 28.43 kg/m   Physical Examination:  General: Awake, alert, well nourished, No acute distress HEENT: Normal, sclera white, MMM Cardio: regular rate and rhythm, S1S2 heard, no murmurs appreciated Pulm: clear to auscultation bilaterally, no wheezes, rhonchi or rales; normal work of breathing on room air Extremities: warm, well perfused, No edema, cyanosis or clubbing; +2 pulses bilaterally Psych: Mood stable, speech normal, affect flat, pleasant and interactive. Depression screen [facility] 2/9 05/03/2018 03/02/2018 10/14/2017  Decreased Interest 3 3 2   Down, Depressed, Hopeless 0 1 1  PHQ - 2 Score 3 4 3   Altered sleeping 3 - 3  Tired, decreased energy 3 0 2  Change in appetite 1 0 1  Feeling bad or failure about yourself  0 0 0  Trouble concentrating 0 0 0  Moving slowly or fidgety/restless 0 0 1  Suicidal thoughts 0 0 0  PHQ-9 Score 10 - 10  Difficult doing work/chores Somewhat difficult Somewhat difficult -  Some recent data might be hidden   GAD 7 : Generalized Anxiety Score 05/03/2018 05/07/2015  Nervous, Anxious, on Edge 2 3  Control/stop worrying 0 0  Worry too much - different things 1 0  Trouble relaxing 0 0  Restless 0 0  Easily annoyed or irritable 2 2  Afraid - awful might happen 0 0  Total GAD 7 Score 5 5  Anxiety Difficulty Somewhat difficult -    Assessment/ Plan: 58 y.o. male   1. Reactive depression Not especially controlled despite addition of SSRI.  He notes that symptoms are refractory to both the Celexa and the Zoloft.  He does not wish to pursue increase of doses and therefore we were taper off of the SSRI.  He can take 25 mg of the Zoloft daily for 1 week then  take 25 mg every other day for 1 week then stop.  Continue Wellbutrin.  Could at some point consider testing testosterone.  Perhaps this is what is contributing to his symptoms.  2.  GERD It appears that his insurance no longer pays for the PPI class.  I have replaced the Protonix with omeprazole which should be cheaper.  We discussed obtaining  a PPI over-the-counter if he finds that it continues to be expensive.  He will follow-up with gastroenterology for colonoscopy.  We discussed that he would not be a good candidate for Cologuard given mother's family history of colon cancer.  At this time, he exhibits no red flag symptoms or signs.  3.  Constipation We discussed addition of Colace for stool softening.  If this is not helpful, we will plan for MiraLAX cleanout.   No orders of the defined types were placed in this encounter.  Meds ordered this encounter  Medications  . sertraline (ZOLOFT) 50 MG tablet    Sig: Take 0.5 tablets (25 mg total) by mouth daily for 7 days, THEN 0.5 tablets (25 mg total) every other day for 7 days. Then stop.    Dispense:  5 tablet    Refill:  0  . omeprazole (PRILOSEC) 20 MG capsule    Sig: Take 1 capsule (20 mg total) by mouth daily.    Dispense:  30 capsule    Refill:  [place], [place] [phone]

## 2018-05-03 NOTE — Patient Instructions (Signed)
For your depression, it sounds like the medication is not effective and therefore we will taper you off of it.  Take 1/2 tablet daily for 1 week then take 1/2 tablet every other day for a week then stop.  Continue the Wellbutrin as directed.  For your acid reflux it appears that the entire class of medication is likely not covered by the insurance anymore because it is over-the-counter.  However, I have sent you in omeprazole 20 mg to replace the Protonix which is cheaper.  This is available over-the-counter so you can consider grabbing a box over-the-counter if you prefer.  Take 1 capsule daily.  For your constipation, add Colace daily to soften your stools.  If you do not find that your stools return to normal with this medicine, call the office and we will send in MiraLAX powder.  We discussed that colonoscopy is still indicated for you for colon cancer screening because of your mother's history of colon cancer.  You would not be a good candidate for Cologuard as that test is for persons without risk factors.

## 2018-05-18 ENCOUNTER — Other Ambulatory Visit: Payer: Self-pay | Admitting: Family Medicine

## 2018-06-01 ENCOUNTER — Other Ambulatory Visit: Payer: Self-pay | Admitting: Family Medicine

## 2018-06-01 ENCOUNTER — Other Ambulatory Visit: Payer: Self-pay | Admitting: *Deleted

## 2018-06-01 MED ORDER — MELOXICAM 15 MG PO TABS: 15.0000 mg | ORAL_TABLET | Freq: Every day | ORAL | 1 refills | Status: DC

## 2018-06-01 NOTE — Telephone Encounter (Signed)
Patient aware and verbalizes understanding- states he is not taking the lexapro.

## 2018-06-01 NOTE — Telephone Encounter (Signed)
I was under the impression patient had discontinued antidepressants totally.  We refilled his Lexapro a few weeks ago.  Please inform of increased GI bleed risk with use of Lexapro and Meloxicam.  Take Meloxicam separately and only as needed.

## 2018-06-09 ENCOUNTER — Ambulatory Visit: Payer: Managed Care, Other (non HMO) | Admitting: Family Medicine

## 2018-06-09 ENCOUNTER — Encounter: Payer: Self-pay | Admitting: Family Medicine

## 2018-06-09 ENCOUNTER — Ambulatory Visit (INDEPENDENT_AMBULATORY_CARE_PROVIDER_SITE_OTHER): Payer: Managed Care, Other (non HMO) | Admitting: Family Medicine

## 2018-06-09 VITALS — BP 122/77 | HR 96 | Temp 97.6°F | Ht 68.0 in | Wt 186.0 lb

## 2018-06-09 DIAGNOSIS — F329 Major depressive disorder, single episode, unspecified: Secondary | ICD-10-CM

## 2018-06-09 MED ORDER — BUPROPION HCL ER (XL) 150 MG PO TB24: 150.0000 mg | ORAL_TABLET | Freq: Every day | ORAL | 0 refills | Status: DC

## 2018-06-09 NOTE — Patient Instructions (Signed)
Take 2 pills a day for 1 week and then 1 pill a day for 1 week of the Wellbutrin 150 mg and then see Dr. Lajuana Ripple in 2 weeks.

## 2018-06-09 NOTE — Progress Notes (Signed)
BP 122/77   Pulse 96   Temp 97.6 F (36.4 C) (Oral)   Ht 5\' 8"  (1.727 m)   Wt 186 lb (84.4 kg)   BMI 28.28 kg/m    Subjective:    Patient ID: Alec Gutierrez, male    DOB: 02-06-1961, 58 y.o.   MRN: [id number]  HPI: ASHKAN CHAMBERLAND is a 58 y.o. male presenting on 06/09/2018 for Discuss  meds (States he received a call from [place] stating he shouldn't be taking one of his meds)   HPI Depression recheck Patient is coming in today for discussing depression, he was supposed to see his PCP Dr. Lajuana Ripple but she had to call in ill today so he is seen me for this today.  He says that she was transferring him to Zoloft but then he was told there was a medication interaction with the Zoloft so he stopped that and went back onto his Wellbutrin 450 but he feels like he does not need it and wants to come down off of it and wants to discuss that today.  I gave him a prescription of Wellbutrin 150 mg so he can taper himself down off the Wellbutrin because when he stopped it cold [place] he felt like he was yawning uncontrollably throughout the day and that is why he went back on it.  He also wants to come back and discuss joint aches and pains in the future with Dr. Lajuana Ripple but he will do that when he returns in 2 weeks with a visit with her. Depression screen [facility] 2/9 06/09/2018 05/03/2018 03/02/2018 10/14/2017 08/31/2017  Decreased Interest 0 3 3 2  0  Down, Depressed, Hopeless 0 0 1 1 0  PHQ - 2 Score 0 3 4 3  0  Altered sleeping - 3 - 3 -  Tired, decreased energy - 3 0 2 -  Change in appetite - 1 0 1 -  Feeling bad or failure about yourself  - 0 0 0 -  Trouble concentrating - 0 0 0 -  Moving slowly or fidgety/restless - 0 0 1 -  Suicidal thoughts - 0 0 0 -  PHQ-9 Score - 10 - 10 -  Difficult doing work/chores - Somewhat difficult Somewhat difficult - -  Some recent data might be hidden     Relevant past medical, surgical, family and social history reviewed and updated as indicated. Interim medical  history since our last visit reviewed. Allergies and medications reviewed and updated.  Review of Systems  Constitutional: Negative for chills and fever.  Respiratory: Negative for shortness of breath and wheezing.   Cardiovascular: Negative for chest pain and leg swelling.  Musculoskeletal: Positive for arthralgias. Negative for back pain and gait problem.  Skin: Negative for rash.  Psychiatric/Behavioral: Negative for dysphoric mood and sleep disturbance. The patient is not nervous/anxious.   All other systems reviewed and are negative.   Per HPI unless specifically indicated above   Allergies as of 06/09/2018   No Known Allergies     Medication List       Accurate as of June 09, 2018  9:09 AM. Always use your most recent med list.        amLODipine 5 MG tablet Commonly known as:  NORVASC Take 1 tablet (5 mg total) by mouth daily.   atorvastatin 20 MG tablet Commonly known as:  LIPITOR Take 1 tablet (20 mg total) by mouth daily.   buPROPion 150 MG 24 hr tablet Commonly known as:  WELLBUTRIN  XL Take 1 tablet (150 mg total) by mouth daily. Take 2/day for 1 week and then 1/day for 1 week   hydrochlorothiazide 25 MG tablet Commonly known as:  HYDRODIURIL Take 1 tablet (25 mg total) by mouth daily.   meloxicam 15 MG tablet Commonly known as:  MOBIC Take 1 tablet (15 mg total) by mouth daily.   omeprazole 20 MG capsule Commonly known as:  PRILOSEC Take 1 capsule (20 mg total) by mouth daily.          Objective:    BP 122/77   Pulse 96   Temp 97.6 F (36.4 C) (Oral)   Ht 5\' 8"  (1.727 m)   Wt 186 lb (84.4 kg)   BMI 28.28 kg/m   Wt Readings from Last 3 Encounters:  06/09/18 186 lb (84.4 kg)  05/03/18 187 lb (84.8 kg)  03/02/18 196 lb (88.9 kg)    Physical Exam Vitals signs and nursing note reviewed.  Constitutional:      General: He is not in acute distress.    Appearance: He is well-developed. He is not diaphoretic.  Eyes:     General: No  scleral icterus.    Conjunctiva/sclera: Conjunctivae normal.  Neck:     Thyroid: No thyromegaly.  Neurological:     Mental Status: He is alert and oriented to person, place, and time.     Coordination: Coordination normal.  Psychiatric:        Mood and Affect: Mood is not anxious or depressed.        Behavior: Behavior normal.        Thought Content: Thought content does not include suicidal ideation. Thought content does not include suicidal plan.        Assessment & Plan:   Problem List Items Addressed This Visit      Other   Depression - Primary   Relevant Medications   buPROPion (WELLBUTRIN XL) 150 MG 24 hr tablet      Will do a taper off of Wellbutrin because patient thinks he does not need it and then he will see Dr. Lajuana Ripple in 2 weeks to see how it is going.  He was prescribed Zoloft but was told there was a medication interaction so he stopped it.  He also is going to increase his omeprazole to 20 twice a day and will discuss this with Dr. Lajuana Ripple when he returns. Follow up plan: Return in about 2 weeks (around 06/23/2018), or if symptoms worsen or fail to improve, for Depression recheck.  Counseling provided for all of the vaccine components No orders of the defined types were placed in this encounter.   Caryl Pina, MD [facility] 06/09/2018, 9:09 AM

## 2018-06-24 ENCOUNTER — Ambulatory Visit: Payer: Managed Care, Other (non HMO) | Admitting: Family Medicine

## 2018-07-02 ENCOUNTER — Ambulatory Visit: Payer: Managed Care, Other (non HMO) | Admitting: Family Medicine

## 2018-07-09 ENCOUNTER — Telehealth: Payer: Self-pay | Admitting: Family Medicine

## 2018-07-10 NOTE — Telephone Encounter (Signed)
Apt changed to tele visit.  

## 2018-07-12 ENCOUNTER — Other Ambulatory Visit: Payer: Self-pay

## 2018-07-12 ENCOUNTER — Ambulatory Visit (INDEPENDENT_AMBULATORY_CARE_PROVIDER_SITE_OTHER): Payer: Managed Care, Other (non HMO) | Admitting: Family Medicine

## 2018-07-12 DIAGNOSIS — R0683 Snoring: Secondary | ICD-10-CM | POA: Diagnosis not present

## 2018-07-12 DIAGNOSIS — K219 Gastro-esophageal reflux disease without esophagitis: Secondary | ICD-10-CM

## 2018-07-12 DIAGNOSIS — F329 Major depressive disorder, single episode, unspecified: Secondary | ICD-10-CM

## 2018-07-12 DIAGNOSIS — R5383 Other fatigue: Secondary | ICD-10-CM

## 2018-07-12 MED ORDER — OMEPRAZOLE 40 MG PO CPDR: 40.0000 mg | DELAYED_RELEASE_CAPSULE | Freq: Every day | ORAL | 1 refills | Status: DC

## 2018-07-12 MED ORDER — BUPROPION HCL ER (XL) 450 MG PO TB24: 450.0000 mg | ORAL_TABLET | Freq: Every day | ORAL | 1 refills | Status: DC

## 2018-07-12 NOTE — Progress Notes (Signed)
Telephone visit  Subjective: CC: f/u depression PCP: Alec Norlander, DO PPJ:KDTOI [name] is a 58 y.o. male calls for telephone consult today. Patient provides verbal consent for consult held via phone.  Location of patient: home Location of provider: [facility] Others present for call: n/a  1. Depression Patient was seen about 1 month ago by another provider for depressive symptoms.  He was instructed to taper off of the Wellbutrin and states that he did proceed with taper but after about 2 days off of medication he felt that depressive symptoms had significantly worsened.  He has since resumed use of Wellbutrin 450 mg and notes that symptoms have gotten somewhat better.  He is noticed improvement in mood swings/anger outbursts.  He does report having only been back on full dose of Wellbutrin for about a week now and states that he is not quite back to his baseline.  No SI, HI.  Depression screen [facility] 2/9 07/12/2018 06/09/2018 05/03/2018  Decreased Interest 3 0 3  Down, Depressed, Hopeless 2 0 0  PHQ - 2 Score 5 0 3  Altered sleeping 3 - 3  Tired, decreased energy 3 - 3  Change in appetite 0 - 1  Feeling bad or failure about yourself  0 - 0  Trouble concentrating 0 - 0  Moving slowly or fidgety/restless 0 - 0  Suicidal thoughts 0 - 0  PHQ-9 Score 11 - 10  Difficult doing work/chores - - Somewhat difficult  Some recent data might be hidden   2.  GERD He reports that he has been using a 40 mg daily of omeprazole in efforts to improve acid reflux.  He denies any nausea, vomiting, abdominal pain, melena or hematochezia.  He is asking that we increase his dose of omeprazole because the 20 mg has been inadequately covering acid reflux symptoms.  3. Low energy/ snoring Reports longstanding history of low energy.  He also reports snoring and apneic episodes in the past.  He has never been evaluated for sleep apnea but would be interested.  His dentist suggested this as well.   ROS: Per  HPI  No Known Allergies Past Medical History:  Diagnosis Date  . GERD (gastroesophageal reflux disease)   . History of BPH     Current Outpatient Medications:  .  amLODipine (NORVASC) 5 MG tablet, Take 1 tablet (5 mg total) by mouth daily., Disp: 90 tablet, Rfl: 3 .  atorvastatin (LIPITOR) 20 MG tablet, Take 1 tablet (20 mg total) by mouth daily., Disp: 90 tablet, Rfl: 3 .  buPROPion (WELLBUTRIN XL) 150 MG 24 hr tablet, Take 1 tablet (150 mg total) by mouth daily. Take 2/day for 1 week and then 1/day for 1 week, Disp: 21 tablet, Rfl: 0 .  hydrochlorothiazide (HYDRODIURIL) 25 MG tablet, Take 1 tablet (25 mg total) by mouth daily., Disp: 90 tablet, Rfl: 3 .  meloxicam (MOBIC) 15 MG tablet, Take 1 tablet (15 mg total) by mouth daily., Disp: 90 tablet, Rfl: 1 .  omeprazole (PRILOSEC) 20 MG capsule, Take 1 capsule (20 mg total) by mouth daily. (Patient taking differently: Take 20 mg by mouth 2 (two) times daily. ), Disp: 30 capsule, Rfl: 3  Assessment/ Plan: 58 y.o. male   1. Reactive depression Okay to resume Wellbutrin XL [place] and 50 mg daily.  I have provided refills. - buPROPion HCl ER, XL, 450 MG TB24; Take 450 mg by mouth daily.  Dispense: 90 tablet; Refill: 1  2. Gastroesophageal reflux disease without esophagitis  Uncontrolled with omeprazole 20 mg daily.  Increase to 40 mg daily.  Prescription sent to the pharmacy  3. Low energy Possibly due to undiagnosed sleep apnea.  I have placed a referral to sleep studies and instructed the patient to schedule an appointment.  We discussed this would likely not be scheduled for several weeks given ongoing pandemic.  Plan to check vitamin B12 and TSH as well at our next office visit. - TSH; Future - Vitamin B12; Future - Ambulatory referral to Sleep Studies  4. Snoring As above - Ambulatory referral to Sleep Studies   Start time: 845am End time: 856am  Meds ordered this encounter  Medications  . buPROPion HCl ER, XL, 450 MG TB24     Sig: Take 450 mg by mouth daily.    Dispense:  90 tablet    Refill:  1  . omeprazole (PRILOSEC) 40 MG capsule    Sig: Take 1 capsule (40 mg total) by mouth daily.    Dispense:  90 capsule    Refill:  Alec Gutierrez, Alec Gutierrez [phone]

## 2018-08-02 ENCOUNTER — Telehealth: Payer: Self-pay | Admitting: Neurology

## 2018-08-02 NOTE — Telephone Encounter (Signed)
Due to current COVID 19 pandemic, our office is severely reducing in office visits for at least the next 2 weeks, in order to minimize the risk to our patients and healthcare providers. The pt declined scheduling a virtual visit. I was going over what the pt would need in order to access the virtual visit. The pt said, "I don't have any of that junk." I asked the pt does he have a smart phone. He stated, "no I don't have a cell phone." I scheduled the pt for a face to face visit on July 6th.

## 2018-08-16 ENCOUNTER — Other Ambulatory Visit: Payer: Self-pay | Admitting: Family Medicine

## 2018-10-25 ENCOUNTER — Institutional Professional Consult (permissible substitution): Payer: Managed Care, Other (non HMO) | Admitting: Neurology

## 2018-11-01 ENCOUNTER — Ambulatory Visit: Payer: Managed Care, Other (non HMO) | Admitting: Family Medicine

## 2018-11-01 ENCOUNTER — Encounter: Payer: Self-pay | Admitting: Family Medicine

## 2018-11-01 ENCOUNTER — Other Ambulatory Visit: Payer: Self-pay

## 2018-11-01 VITALS — BP 127/84 | HR 94 | Temp 98.1°F | Ht 68.0 in | Wt 193.0 lb

## 2018-11-01 DIAGNOSIS — I10 Essential (primary) hypertension: Secondary | ICD-10-CM

## 2018-11-01 DIAGNOSIS — E7800 Pure hypercholesterolemia, unspecified: Secondary | ICD-10-CM

## 2018-11-01 DIAGNOSIS — M79644 Pain in right finger(s): Secondary | ICD-10-CM

## 2018-11-01 DIAGNOSIS — R5383 Other fatigue: Secondary | ICD-10-CM

## 2018-11-01 DIAGNOSIS — F329 Major depressive disorder, single episode, unspecified: Secondary | ICD-10-CM

## 2018-11-01 DIAGNOSIS — M150 Primary generalized (osteo)arthritis: Secondary | ICD-10-CM

## 2018-11-01 DIAGNOSIS — Z1159 Encounter for screening for other viral diseases: Secondary | ICD-10-CM

## 2018-11-01 DIAGNOSIS — M159 Polyosteoarthritis, unspecified: Secondary | ICD-10-CM

## 2018-11-01 DIAGNOSIS — K219 Gastro-esophageal reflux disease without esophagitis: Secondary | ICD-10-CM

## 2018-11-01 MED ORDER — OMEPRAZOLE 40 MG PO CPDR: 40.0000 mg | DELAYED_RELEASE_CAPSULE | Freq: Every day | ORAL | 1 refills | Status: DC

## 2018-11-01 MED ORDER — MELOXICAM 15 MG PO TABS: 15.0000 mg | ORAL_TABLET | Freq: Every day | ORAL | 1 refills | Status: DC

## 2018-11-01 MED ORDER — BUPROPION HCL ER (XL) 450 MG PO TB24: 450.0000 mg | ORAL_TABLET | Freq: Every day | ORAL | 1 refills | Status: DC

## 2018-11-01 NOTE — Patient Instructions (Addendum)
You had labs performed today.  You will be contacted with the results of the labs once they are available, usually in the next 3 business days for routine lab work.  If you have an active my chart account, they will be released to your MyChart.  If you prefer to have these labs released to you via telephone, please let us know.  If you had a pap smear or biopsy performed, expect to be contacted in about 7-10 days.  Call Dr Guadelupe Sabin office and see if you qualify for a home sleep study since the other is unaffordable.  You will be contacted with the appointment date for the hand specialist.  Be ready to go at 8am that morning.

## 2018-11-01 NOTE — Progress Notes (Signed)
Subjective: CC: f/u HTN, HLD, fatigue, depression PCP: Janora Norlander, DO LOV:FIEPP Alec Gutierrez is a 58 y.o. male presenting to clinic today for:  1.  Hypertension with hyperlipidemia Patient reports compliance with Norvasc 5 mg daily, hydrochlorothiazide 25 mg daily and atorvastatin 20 mg daily.  He denies any chest pain, shortness breath, lower extreme edema, dizziness or loss of consciousness.  2.  Depression/ Fatigue Patient reports fair control of depressive disorder with bupropion for gender 50 mg XL daily.  No SI or HI.  Continues to have fatigue.  He has not followed up with neurology for sleep study as he was told that he would have to pay upfront and it was unaffordable.  He does report ongoing daily fatigue.  He reports apneic episodes during sleep.  3.  Arthritis Patient reports longstanding history of arthritis that has been fairly well maintained with meloxicam.  However he does note that he has an area along the right thumb where it becomes very tender and painful and can last up to a couple of weeks before it resolves.  This is not responsive to meloxicam.  He does report contractures within the hands.   ROS: Per HPI  No Known Allergies Past Medical History:  Diagnosis Date  . GERD (gastroesophageal reflux disease)   . History of BPH     Current Outpatient Medications:  .  amLODipine (NORVASC) 5 MG tablet, Take 1 tablet (5 mg total) by mouth daily., Disp: 90 tablet, Rfl: 3 .  atorvastatin (LIPITOR) 20 MG tablet, Take 1 tablet (20 mg total) by mouth daily., Disp: 90 tablet, Rfl: 3 .  buPROPion HCl ER, XL, 450 MG TB24, Take 450 mg by mouth daily., Disp: 90 tablet, Rfl: 1 .  hydrochlorothiazide (HYDRODIURIL) 25 MG tablet, Take 1 tablet (25 mg total) by mouth daily., Disp: 90 tablet, Rfl: 3 .  meloxicam (MOBIC) 15 MG tablet, Take 1 tablet (15 mg total) by mouth daily., Disp: 90 tablet, Rfl: 1 .  omeprazole (PRILOSEC) 20 MG capsule, Take 1 capsule (20 mg total) by mouth  daily., Disp: 90 capsule, Rfl: 2 .  omeprazole (PRILOSEC) 40 MG capsule, Take 1 capsule (40 mg total) by mouth daily., Disp: 90 capsule, Rfl: 1 Social History   Socioeconomic History  . Marital status: Divorced    Spouse name: Not on file  . Number of children: Not on file  . Years of education: Not on file  . Highest education level: Not on file  Occupational History  . Not on file  Social Needs  . Financial resource strain: Not on file  . Food insecurity    Worry: Not on file    Inability: Not on file  . Transportation needs    Medical: Not on file    Non-medical: Not on file  Tobacco Use  . Smoking status: Former Smoker    Packs/day: 0.25    Types: Cigarettes    Quit date: 05/15/2015    Years since quitting: 3.4  . Smokeless tobacco: Never Used  Substance and Sexual Activity  . Alcohol use: Yes    Alcohol/week: 5.0 standard drinks    Types: 5 Standard drinks or equivalent per week  . Drug use: No  . Sexual activity: Not on file  Lifestyle  . Physical activity    Days per week: Not on file    Minutes per session: Not on file  . Stress: Not on file  Relationships  . Social Herbalist on  phone: Not on file    Gets together: Not on file    Attends religious service: Not on file    Active member of club or organization: Not on file    Attends meetings of clubs or organizations: Not on file    Relationship status: Not on file  . Intimate partner violence    Fear of current or ex partner: Not on file    Emotionally abused: Not on file    Physically abused: Not on file    Forced sexual activity: Not on file  Other Topics Concern  . Not on file  Social History Narrative  . Not on file   Family History  Problem Relation Age of Onset  . Cancer Mother   . Heart disease Father   . Stroke Father     Objective: Office vital signs reviewed. BP 127/84   Pulse 94   Temp 98.1 F (36.7 C) (Oral)   Ht _0  (1.727 m)   Wt 193 lb (87.5 kg)   BMI 29.35  kg/m   Physical Examination:  General: Awake, alert, well nourished, No acute distress HEENT: Normal, sclera white, no exophthalmos    Neck: No masses palpated. No lymphadenopathy; no goiter or thyroid masses Cardio: regular rate and rhythm, S1S2 heard, no murmurs appreciated Pulm: clear to auscultation bilaterally, no wheezes, rhonchi or rales; normal work of breathing on room air Extremities: warm, well perfused, No edema, cyanosis or clubbing; +2 pulses bilaterally MSK: normal gait and station; bilateral hands with arthritic changes.  The ring fingers of bilateral hands with limited range of motion in extension.  The thumb without joint swelling on the right hand.  No palpable bony abnormalities other than mild arthritic changes. Skin: dry; intact; no rashes or lesions; normal temperature  neuro: No resting tremor noted Psych: Mood stable, speech normal, affect flat. Depression screen [facility] 2/9 11/01/2018 07/12/2018 06/09/2018  Decreased Interest 0 3 0  Down, Depressed, Hopeless 0 2 0  PHQ - 2 Score 0 5 0  Altered sleeping 0 3 -  Tired, decreased energy 0 3 -  Change in appetite 0 0 -  Feeling bad or failure about yourself  0 0 -  Trouble concentrating 0 0 -  Moving slowly or fidgety/restless 0 0 -  Suicidal thoughts 0 0 -  PHQ-9 Score 0 11 -  Difficult doing work/chores - - -  Some recent data might be hidden   Assessment/ Plan: 58 y.o. male   1. Essential hypertension Controlled.  Continue current regimen with Norvasc and hydrochlorothiazide - CMP14+EGFR  2. Pure hypercholesterolemia Plan for fasting lipid panel today.  Continue statin - Lipid panel - CMP14+EGFR  3. Reactive depression Stable with Wellbutrin XL 450.  Continue - buPROPion HCl ER, XL, 450 MG TB24; Take 450 mg by mouth daily.  Dispense: 90 tablet; Refill: 1  4. Fatigue, unspecified type Likely secondary to untreated sleep apnea.  We did discuss consideration for home sleep study have asked him to contact  the neurologist about this.  It appears that the formal sleep study is unaffordable for this patient at this time.  Could be on auto titration CPAP if needed. - CBC - TSH - Vitamin B12 - CMP14+EGFR  5. Encounter for hepatitis C screening test for low risk patient - Hepatitis C antibody  6. Primary osteoarthritis involving multiple joints I will place referral to orthopedics for further evaluation of contractures of the hand and joint pain in the right thumb.  Continue  meloxicam as needed - meloxicam (MOBIC) 15 MG tablet; Take 1 tablet (15 mg total) by mouth daily.  Dispense: 90 tablet; Refill: 1 - Ambulatory referral to Orthopedic Surgery  7. Gastroesophageal reflux disease without esophagitis Controlled.  Chronic use of NSAID as above.  Continue 40 mg of omeprazole - omeprazole (PRILOSEC) 40 MG capsule; Take 1 capsule (40 mg total) by mouth daily.  Dispense: 90 capsule; Refill: 1  8. Thumb pain, right - Ambulatory referral to Orthopedic Surgery   No orders of the defined types were placed in this encounter.  No orders of the defined types were placed in this encounter.    Janora Norlander, DO [place] [phone]

## 2018-11-02 LAB — CBC
Hematocrit: 42.7 % (ref 37.5–51.0)
Hemoglobin: 14.4 g/dL (ref 13.0–17.7)
MCH: 29.3 pg (ref 26.6–33.0)
MCHC: 33.7 g/dL (ref 31.5–35.7)
MCV: 87 fL (ref 79–97)
Platelets: 321 10*3/uL (ref 150–450)
RBC: 4.92 x10E6/uL (ref 4.14–5.80)
RDW: 12.3 % (ref 11.6–15.4)
WBC: 9 10*3/uL (ref 3.4–10.8)

## 2018-11-02 LAB — CMP14+EGFR
ALT: 31 IU/L (ref 0–44)
AST: 17 IU/L (ref 0–40)
Albumin/Globulin Ratio: 2.4 — ABNORMAL HIGH (ref 1.2–2.2)
Albumin: 4.6 g/dL (ref 3.8–4.9)
Alkaline Phosphatase: 69 IU/L (ref 39–117)
BUN/Creatinine Ratio: 18 (ref 9–20)
BUN: 19 mg/dL (ref 6–24)
Bilirubin Total: 0.5 mg/dL (ref 0.0–1.2)
CO2: 21 mmol/L (ref 20–29)
Calcium: 9.5 mg/dL (ref 8.7–10.2)
Chloride: 102 mmol/L (ref 96–106)
Creatinine, Ser: 1.03 mg/dL (ref 0.76–1.27)
GFR calc Af Amer: 92 mL/min/{1.73_m2} (ref >59)
GFR calc non Af Amer: 80 mL/min/{1.73_m2} (ref >59)
Globulin, Total: 1.9 g/dL (ref 1.5–4.5)
Glucose: 113 mg/dL — ABNORMAL HIGH (ref 65–99)
Potassium: 4 mmol/L (ref 3.5–5.2)
Sodium: 141 mmol/L (ref 134–144)
Total Protein: 6.5 g/dL (ref 6.0–8.5)

## 2018-11-02 LAB — HEPATITIS C ANTIBODY: Hep C Virus Ab: 0.1 s/co ratio (ref 0.0–0.9)

## 2018-11-02 LAB — LIPID PANEL
Chol/HDL Ratio: 3.5 ratio (ref 0.0–5.0)
Cholesterol, Total: 176 mg/dL (ref 100–199)
HDL: 51 mg/dL (ref >39)
LDL Calculated: 88 mg/dL (ref 0–99)
Triglycerides: 183 mg/dL — ABNORMAL HIGH (ref 0–149)
VLDL Cholesterol Cal: 37 mg/dL (ref 5–40)

## 2018-11-02 LAB — TSH: TSH: 2.18 u[IU]/mL (ref 0.450–4.500)

## 2018-11-02 LAB — VITAMIN B12: Vitamin B-12: 260 pg/mL (ref 232–1245)

## 2018-11-05 ENCOUNTER — Encounter: Payer: Self-pay | Admitting: *Deleted

## 2018-11-18 ENCOUNTER — Encounter: Payer: Self-pay | Admitting: Neurology

## 2018-11-18 ENCOUNTER — Other Ambulatory Visit: Payer: Self-pay

## 2018-11-18 ENCOUNTER — Ambulatory Visit: Payer: Managed Care, Other (non HMO) | Admitting: Neurology

## 2018-11-18 VITALS — BP 149/94 | HR 100 | Ht 68.0 in | Wt 190.0 lb

## 2018-11-18 DIAGNOSIS — E663 Overweight: Secondary | ICD-10-CM

## 2018-11-18 DIAGNOSIS — R351 Nocturia: Secondary | ICD-10-CM

## 2018-11-18 DIAGNOSIS — R0689 Other abnormalities of breathing: Secondary | ICD-10-CM

## 2018-11-18 DIAGNOSIS — G4726 Circadian rhythm sleep disorder, shift work type: Secondary | ICD-10-CM

## 2018-11-18 DIAGNOSIS — G4719 Other hypersomnia: Secondary | ICD-10-CM | POA: Diagnosis not present

## 2018-11-18 DIAGNOSIS — R0683 Snoring: Secondary | ICD-10-CM

## 2018-11-18 NOTE — Patient Instructions (Signed)

## 2018-11-18 NOTE — Progress Notes (Signed)
Subjective:    Patient ID: Alec Gutierrez is a 58 y.o. male.  HPI     Star Age, MD, PhD [facility] [address], [address] [address] [place],  [address]  Dear Dr. Lajuana Ripple, I saw your patient, Alec Gutierrez, upon your kind request in my sleep clinic today for initial consultation of his sleep disorder, in particular, concern for underlying obstructive sleep apnea.  The patient is unaccompanied today.  As you know, Alec Gutierrez is a 58 year old right-handed gentleman with an underlying medical history of hypertension, arthritis, reflux disease, hyperlipidemia, BPH, and overweight state, who reports snoring and excessive daytime somnolence.  He has woken himself up with a sense of gasping for air and from his own snoring.  Of note, he works third shift and has worked nights for 10 years.  His work hours are from 10 PM to 6 AM.  He works as a Games developer at [employer].  He goes to bed around 1 PM and rise time is around 8:30 PM.  On the weekend, he sleeps at night.  When he gets off of work on Friday morning he will stay up till the evening to go to bed. I reviewed telemedicine phone note from 07/12/2018.  His Epworth sleepiness score is 13 out of 24, fatigue severity score is 42 out of 63.  He is divorced for over 55 years, he has a 72 year old son.  He lives alone.  He quit smoking about 6 years ago, he drinks alcohol in the form of beer, on average 6/day, no daily caffeine.  He has nocturia about once per average night, denies any recurrent morning headaches.  He does wake up with a dry mouth.  Sometime ago his dentist recommended a nasal clip for his snoring which worked a little bit but did not stay on well. He started gaining weight after he started working nights.  His Past Medical History Is Significant For: Past Medical History:  Diagnosis Date  . Arthritis   . GERD (gastroesophageal reflux disease)   . History of BPH   .  Hyperlipidemia   . Hypertension     His Past Surgical History Is Significant For: Past Surgical History:  Procedure Laterality Date  . HERNIA REPAIR     inguinal and abd    His Family History Is Significant For: Family History  Problem Relation Age of Onset  . Cancer Mother   . Heart disease Father   . Stroke Father     His Social History Is Significant For: Social History   Socioeconomic History  . Marital status: Divorced    Spouse name: Not on file  . Number of children: Not on file  . Years of education: Not on file  . Highest education level: Not on file  Occupational History  . Not on file  Social Needs  . Financial resource strain: Not on file  . Food insecurity    Worry: Not on file    Inability: Not on file  . Transportation needs    Medical: Not on file    Non-medical: Not on file  Tobacco Use  . Smoking status: Former Smoker    Packs/day: 0.25    Types: Cigarettes    Quit date: 05/15/2015    Years since quitting: 3.5  . Smokeless tobacco: Never Used  Substance and Sexual Activity  . Alcohol use: Yes    Alcohol/week: 5.0 standard drinks    Types: 5 Standard drinks or  equivalent per week  . Drug use: No  . Sexual activity: Not on file  Lifestyle  . Physical activity    Days per week: Not on file    Minutes per session: Not on file  . Stress: Not on file  Relationships  . Social Herbalist on phone: Not on file    Gets together: Not on file    Attends religious service: Not on file    Active member of club or organization: Not on file    Attends meetings of clubs or organizations: Not on file    Relationship status: Not on file  Other Topics Concern  . Not on file  Social History Narrative  . Not on file    His Allergies Are:  No Known Allergies:   His Current Medications Are:  Outpatient Encounter Medications as of 11/18/2018  Medication Sig  . amLODipine (NORVASC) 5 MG tablet Take 1 tablet (5 mg total) by mouth daily.  Marland Kitchen  atorvastatin (LIPITOR) 20 MG tablet Take 1 tablet (20 mg total) by mouth daily.  Marland Kitchen buPROPion HCl ER, XL, 450 MG TB24 Take 450 mg by mouth daily.  . hydrochlorothiazide (HYDRODIURIL) 25 MG tablet Take 1 tablet (25 mg total) by mouth daily.  . meloxicam (MOBIC) 15 MG tablet Take 1 tablet (15 mg total) by mouth daily.  Marland Kitchen omeprazole (PRILOSEC) 40 MG capsule Take 1 capsule (40 mg total) by mouth daily.   No facility-administered encounter medications on file as of 11/18/2018.   :  Review of Systems:  Out of a complete 14 point review of systems, all are reviewed and negative with the exception of these symptoms as listed below: Review of Systems  Neurological:       Pt presents today to discuss his sleep. Pt has never had a sleep study but does endorse snoring.  Epworth Sleepiness Scale 0= would never doze 1= slight chance of dozing 2= moderate chance of dozing 3= high chance of dozing  Sitting and reading: 3 Watching TV: 3 Sitting inactive in a public place (ex. Theater or meeting): 2 As a passenger in a car for an hour without a break: 1 Lying down to rest in the afternoon: 3 Sitting and talking to someone: 0 Sitting quietly after lunch (no alcohol): 1 In a car, while stopped in traffic: 0 Total: 13     Objective:  Neurological Exam  Physical Exam Physical Examination:   Vitals:   11/18/18 0842  BP: (!) 149/94  Pulse: 100   General Examination: The patient is a very pleasant 58 y.o. male in no acute distress. He appears well-developed and well-nourished and well groomed.   HEENT: Normocephalic, atraumatic, pupils are equal, round and reactive to light and accommodation. Extraocular tracking is good without limitation to gaze excursion or nystagmus noted. Normal smooth pursuit is noted. Hearing is grossly intact. Face is symmetric with normal facial animation and normal facial sensation. Speech is clear with no dysarthria noted. There is no hypophonia. There is no lip,  neck/head, jaw or voice tremor. Neck is supple with full range of passive and active motion. There are no carotid bruits on auscultation. Oropharynx exam reveals: mild mouth dryness, adequate dental hygiene and moderate airway crowding, due to Small airway entry, redundant soft palate, tip of uvula not fully visualized, tonsils are small, Mallampati is class II, neck circumference is 17-1/8 inches.  He has a mild overbite.  Tongue protrudes centrally and palate elevates symmetrically.  Mallampati is  class II. Tongue protrudes centrally and palate elevates symmetrically.    Chest: Clear to auscultation without wheezing, rhonchi or crackles noted.  Heart: S1+S2+0, regular and normal without murmurs, rubs or gallops noted.   Abdomen: Soft, non-tender and non-distended with normal bowel sounds appreciated on auscultation.  Extremities: There is no pitting edema in the distal lower extremities bilaterally.   Skin: Warm and dry without trophic changes noted.  Musculoskeletal: exam reveals no obvious joint deformities, tenderness or joint swelling or erythema.   Neurologically:  Mental status: The patient is awake, alert and oriented in all 4 spheres. His immediate and remote memory, attention, language skills and fund of knowledge are appropriate. There is no evidence of aphasia, agnosia, apraxia or anomia. Speech is clear with normal prosody and enunciation. Thought process is linear. Mood is normal and affect is normal.  Cranial nerves II - XII are as described above under HEENT exam. In addition: shoulder shrug is normal with equal shoulder height noted. Motor exam: Normal bulk, strength and tone is noted. There is no drift, tremor or rebound. Romberg is negative. Reflexes are 1+. Fine motor skills and coordination: intact grossly in the UEs and LEs.  Cerebellar testing: No dysmetria or intention tremor on finger to nose testing. Heel to shin is unremarkable bilaterally. There is no truncal or gait  ataxia.  Sensory exam: intact to light touch in the upper and lower extremities.  Gait, station and balance: He stands easily. No veering to one side is noted. No leaning to one side is noted. Posture is age-appropriate and stance is narrow based. Gait shows normal stride length and normal pace. No problems turning are noted. Tandem walk is Somewhat challenging but doable  Assessment and Plan:  In summary, Alec Gutierrez is a very pleasant 58 y.o.-year old male with a history and physical exam concerning for obstructive sleep apnea (OSA). I had a long chat with the patient about my findings and the diagnosis of OSA, its prognosis and treatment options. We talked about medical treatments, surgical interventions and non-pharmacological approaches. I explained in particular the risks and ramifications of untreated moderate to severe OSA, especially with respect to developing cardiovascular disease down the Road, including congestive heart failure, difficult to treat hypertension, cardiac arrhythmias, or stroke. Even type 2 diabetes has, in part, been linked to untreated OSA. Symptoms of untreated OSA include daytime sleepiness, memory problems, mood irritability and mood disorder such as depression and anxiety, lack of energy, as well as recurrent headaches, especially morning headaches. We talked about trying to maintain a healthy lifestyle in general, as well as the importance of weight control. I encouraged the patient to eat healthy, exercise daily and keep well hydrated, to keep a scheduled bedtime and wake time routine, to not skip any meals and eat healthy snacks in between meals. I advised the patient not to drive when feeling sleepy. I recommended the following at this time: sleep testing, he would like to do testing at home, and given his shift work and variable sleep schedule, it may be easiest to do eval at home.   I explained the sleep test procedure to the patient and also outlined possible  surgical and non-surgical treatment options of OSA, including the use of a custom-made dental device (which would require a referral to a specialist dentist or oral surgeon), upper airway surgical options, such as pillar implants, radiofrequency surgery, tongue base surgery, and UPPP (which would involve a referral to an ENT surgeon). Rarely, jaw surgery  such as mandibular advancement may be considered.  I also explained the CPAP treatment option to the patient, who indicated that he would be willing to try CPAP if the need arises. I explained the importance of being compliant with PAP treatment, not only for insurance purposes but primarily to improve His symptoms, and for the patient's long term health benefit, including to reduce His cardiovascular risks. I answered all his questions today and the patient was in agreement. I plan to see him back after the sleep study is completed and encouraged him to call with any interim questions, concerns, problems or updates.   Thank you very much for allowing me to participate in the care of this nice patient. If I can be of any further assistance to you please do not hesitate to call me at [phone].  Sincerely,   Star Age, MD, PhD

## 2018-12-08 ENCOUNTER — Other Ambulatory Visit: Payer: Self-pay

## 2018-12-08 ENCOUNTER — Ambulatory Visit (INDEPENDENT_AMBULATORY_CARE_PROVIDER_SITE_OTHER): Payer: Managed Care, Other (non HMO) | Admitting: Neurology

## 2018-12-08 DIAGNOSIS — G4719 Other hypersomnia: Secondary | ICD-10-CM

## 2018-12-08 DIAGNOSIS — G4726 Circadian rhythm sleep disorder, shift work type: Secondary | ICD-10-CM

## 2018-12-08 DIAGNOSIS — E663 Overweight: Secondary | ICD-10-CM

## 2018-12-08 DIAGNOSIS — G4733 Obstructive sleep apnea (adult) (pediatric): Secondary | ICD-10-CM

## 2018-12-08 DIAGNOSIS — R351 Nocturia: Secondary | ICD-10-CM

## 2018-12-08 DIAGNOSIS — R0683 Snoring: Secondary | ICD-10-CM

## 2018-12-08 DIAGNOSIS — R0689 Other abnormalities of breathing: Secondary | ICD-10-CM

## 2018-12-15 NOTE — Progress Notes (Signed)
Patient referred by Dr. Lajuana Ripple, seen by me on 11/18/18, HST on 12/08/18.    Please call and notify the patient that the recent home sleep test showed obstructive sleep apnea in the severe range. While I recommend treatment for this in the form CPAP, his insurance will not approve a sleep study for this. They will likely only approve a trial of autoPAP, which means, that we don't have to bring him in for a sleep study with CPAP, but will let him start using a so called autoPAP machine at home, through a DME company (of his choice, or as per insurance requirement). The DME representative will educate him on how to use the machine, how to put the mask on, etc. I have placed an order in the chart. Please send referral, talk to patient, send report to referring MD. We will need a FU in sleep clinic for 10 weeks post-PAP set up, please arrange that with me or one of our NPs. Thanks,   Alec Age, MD, PhD [facility])

## 2018-12-15 NOTE — Addendum Note (Signed)
Addended by: Star Age on: 12/15/2018 07:19 PM   Modules accepted: Orders

## 2018-12-15 NOTE — Procedures (Signed)
Patient Information     First Name: Alec Last Name: Gutierrez ID: [id number]  Birth Date: 03-25-1961 Age: 58 Gender:  Referring Provider: Leatrice Jewels Gottschalk,MD BMI: 28.7 (W=189 lb, H=5' 8'')  Neck Circ.:  48 '' Epworth:  13   Sleep Study Information    Study Date: Dec 08, 2018 S/H/A Version: [id number] / 4.0.1515 / 22  History:    58 year old man with a history of hypertension, arthritis, reflux disease, hyperlipidemia, BPH, and overweight state, who reports snoring and excessive daytime somnolence.  He has woken himself up with a sense of gasping for air and from his own snoring.  Of note, he works third shift.  Summary & Diagnosis:     Severe OSA   Recommendations:      This home sleep test demonstrates severe obstructive sleep apnea with a total AHI of 65.2/hour and O2 nadir of 73%. Given the patient's medical history and sleep related complaints, treatment with positive airway pressure (in the form of CPAP) is recommended. This will require a full night CPAP titration study for proper treatment settings, O2 monitoring and mask fitting. Based on the severity of the sleep disordered breathing an attended titration study is indicated. However, patient's insurance has denied an attended sleep study; therefore, the patient will be advised to proceed with an autoPAP titration/trial at home for now. Please note that untreated obstructive sleep apnea may carry additional perioperative morbidity. Patients with significant obstructive sleep apnea should receive perioperative PAP therapy and the surgeons and particularly the anesthesiologist should be informed of the diagnosis and the severity of the sleep disordered breathing. The patient should be cautioned not to drive, work at heights, or operate dangerous or heavy equipment when tired or sleepy. Review and reiteration of good sleep hygiene measures should be pursued with any patient. Other causes of the patient's symptoms, including circadian rhythm  disturbances, an underlying mood disorder, medication effect and/or an underlying medical problem cannot be ruled out based on this test. Clinical correlation is recommended. The patient and his referring provider will be notified of the test results. The patient will be seen in follow up in sleep clinic at [facility].  I certify that I have reviewed the raw data recording prior to the issuance of this report in accordance with the standards of the American Academy of Sleep Medicine (AASM).  Star Age, MD, PhD [facility]) Diplomat, ABPN (Neurology and Sleep)            Sleep Summary  Oxygen Saturation Statistics   Start Study Time: End Study Time: Total Recording Time:  1:30:40 PM 9:23:30 PM 7 hrs, 73min  Total Sleep Time % REM of Sleep Time:  6 hrs, 7 min 9.8    Mean: 93 Minimum: 73 Maximum: 100  Mean of Desaturations Nadirs (%):   86  Oxygen Desatur. %:  4-9 10-20 >20 Total  Events Number Total   69 18.7  295  5 79.9 1.4  369 100.0  Oxygen Saturation: <90 <=88  <85 <80 <70  Duration (minutes): Sleep % 61.3 16.7 47.6 13.0 13.2 1.3 3.6 0.4 0.0 0.0     Respiratory Indices      Total Events REM NREM All Night  pRDI:  377  pAHI:  376 ODI:  369  pAHIc:  65  % CSR: 0.0 56.0 56.0 48.3 9.7 66.3 66.1 65.6 11.4 65.4 65.2 64.0 11.3       Pulse Rate Statistics during Sleep (BPM)  Mean: 93 Minimum: 42 Maximum: 142    Indices are calculated using technically valid sleep time of  5 hrs, 45 min. pRDI/pAHI are calculated using oxi desaturations ? 3% Sit Body Position Statistics  Position Supine Prone Right Left Non-Supine  Sleep (min) 68.0 13.0 151.0 135.0 299.0  Sleep % 18.5 3.5 41.1 36.8 81.5  pRDI 85.2 22.8 45.1 82.2 60.8  pAHI 85.2 22.8 44.7 82.2 60.6  ODI 86.1 11.4 43.9 80.2 58.9     Snoring Statistics Snoring Level (dB) >40 >50 >60 >70 >80 >Threshold (45)  Sleep (min) 327.7 37.3 7.7 0.0 0.0 86.8  Sleep % 89.3 10.2 2.1  0.0 0.0 23.6    Mean: 44 dB Sleep Stages Chart                                                                             pAHI=65.2

## 2018-12-16 ENCOUNTER — Telehealth: Payer: Self-pay

## 2018-12-16 NOTE — Telephone Encounter (Signed)
-----   Message from Star Age, MD sent at 12/15/2018  7:19 PM EDT ----- Patient referred by Dr. Lajuana Ripple, seen by me on 11/18/18, HST on 12/08/18.    Please call and notify the patient that the recent home sleep test showed obstructive sleep apnea in the severe range. While I recommend treatment for this in the form CPAP, his insurance will not approve a sleep study for this. They will likely only approve a trial of autoPAP, which means, that we don't have to bring him in for a sleep study with CPAP, but will let him start using a so called autoPAP machine at home, through a DME company (of his choice, or as per insurance requirement). The DME representative will educate him on how to use the machine, how to put the mask on, etc. I have placed an order in the chart. Please send referral, talk to patient, send report to referring MD. We will need a FU in sleep clinic for 10 weeks post-PAP set up, please arrange that with me or one of our NPs. Thanks,   Star Age, MD, PhD [facility])

## 2018-12-16 NOTE — Telephone Encounter (Signed)
I called pt. I advised pt that Dr. Rexene Alberts reviewed their sleep study results and found that pt has severe osa. Dr. Rexene Alberts recommends that pt start an auto pap at home. I reviewed PAP compliance expectations with the pt. Pt is agreeable to starting an auto-PAP. I advised pt that an order will be sent to a DME, [facility], and [facility] will call the pt within about one week after they file with the pt's insurance. [facility] Romans will show the pt how to use the machine, fit for masks, and troubleshoot the auto-PAP if needed. A follow up appt was made for insurance purposes with Amy, NP on 02/21/2019 at 7:30am. Pt verbalized understanding to arrive 15 minutes early and bring their auto-PAP. A letter with all of this information in it will be mailed to the pt as a reminder. I verified with the pt that the address we have on file is correct. Pt verbalized understanding of results. Pt had no questions at this time but was encouraged to call back if questions arise. I have sent the order to [facility] and have received confirmation that they have received the order.

## 2019-01-06 ENCOUNTER — Telehealth: Payer: Self-pay | Admitting: Family Medicine

## 2019-01-06 NOTE — Telephone Encounter (Signed)
Aware of results. 

## 2019-02-21 ENCOUNTER — Ambulatory Visit: Payer: Self-pay | Admitting: Family Medicine

## 2019-03-22 NOTE — Progress Notes (Addendum)
PATIENT: Alec Gutierrez DOB: [birth year]  REASON FOR VISIT: follow up HISTORY FROM: patient  Chief Complaint  Patient presents with  . Follow-up    Initial cpap f/u. Alone. New room. No new concerns at this time.      HISTORY OF PRESENT ILLNESS: Today 03/23/19 Alec Gutierrez is a 58 y.o. male here today for follow up for OSA on CPAP.  He reports that overall, he is adjusting fairly well.  He works third shift.  He notes that there are times, specifically on the weekend, where he will fall asleep and not place CPAP mask.  He is using a nasal mask and is concerned that air is escaping through his mouth.  He does note increased energy levels after using CPAP.  Compliance report dated 02/20/2019 through 03/21/2019 reveals that he used CPAP 24 of the last 30 days for compliance of 80%.  20 days he used CPAP greater than 4 hours for compliance of 57%.  Average usage was 5 hours and 49 minutes.  Residual AHI was 6.0 on 7 to 15 cm of water and an EPR of 1.  Pressure in the 95th percentile of 12.1.  There was a significant leak noted in the 95th percentile of 43.9.    HISTORY: (copied from Dr Guadelupe Sabin note on 11/18/2018)  Dear Dr. Lajuana Ripple, I saw your patient, Alec Gutierrez, upon your kind request in my sleep clinic today for initial consultation of his sleep disorder, in particular, concern for underlying obstructive sleep apnea.  The patient is unaccompanied today.  As you know, Alec Gutierrez is a 58 year old right-handed gentleman with an underlying medical history of hypertension, arthritis, reflux disease, hyperlipidemia, BPH, and overweight state, who reports snoring and excessive daytime somnolence.  He has woken himself up with a sense of gasping for air and from his own snoring.  Of note, he works third shift and has worked nights for 10 years.  His work hours are from 10 PM to 6 AM.  He works as a Games developer at [employer].  He goes to bed around 1 PM and rise time is  around 8:30 PM.  On the weekend, he sleeps at night.  When he gets off of work on Friday morning he will stay up till the evening to go to bed. I reviewed telemedicine phone note from 07/12/2018.  His Epworth sleepiness score is 13 out of 24, fatigue severity score is 42 out of 63.  He is divorced for over 77 years, he has a 69 year old son.  He lives alone.  He quit smoking about 6 years ago, he drinks alcohol in the form of beer, on average 6/day, no daily caffeine.  He has nocturia about once per average night, denies any recurrent morning headaches.  He does wake up with a dry mouth.  Sometime ago his dentist recommended a nasal clip for his snoring which worked a little bit but did not stay on well. He started gaining weight after he started working nights.   REVIEW OF SYSTEMS: Out of a complete 14 system review of symptoms, the patient complains only of the following symptoms, memory loss, headaches and all other reviewed systems are negative.  Epworth sleepiness scale: 8  ALLERGIES: No Known Allergies  HOME MEDICATIONS: Outpatient Medications Prior to Visit  Medication Sig Dispense Refill  . amLODipine (NORVASC) 5 MG tablet Take 1 tablet (5 mg total) by mouth daily. 90 tablet 3  . atorvastatin (LIPITOR) 20 MG tablet  Take 1 tablet (20 mg total) by mouth daily. 90 tablet 3  . buPROPion HCl ER, XL, 450 MG TB24 Take 450 mg by mouth daily. 90 tablet 1  . hydrochlorothiazide (HYDRODIURIL) 25 MG tablet Take 1 tablet (25 mg total) by mouth daily. 90 tablet 3  . meloxicam (MOBIC) 15 MG tablet Take 1 tablet (15 mg total) by mouth daily. 90 tablet 1  . omeprazole (PRILOSEC) 40 MG capsule Take 1 capsule (40 mg total) by mouth daily. 90 capsule 1   No facility-administered medications prior to visit.     PAST MEDICAL HISTORY: Past Medical History:  Diagnosis Date  . Arthritis   . GERD (gastroesophageal reflux disease)   . History of BPH   . Hyperlipidemia   . Hypertension     PAST SURGICAL  HISTORY: Past Surgical History:  Procedure Laterality Date  . HERNIA REPAIR     inguinal and abd    FAMILY HISTORY: Family History  Problem Relation Age of Onset  . Cancer Mother   . Heart disease Father   . Stroke Father     SOCIAL HISTORY: Social History   Socioeconomic History  . Marital status: Divorced    Spouse name: Not on file  . Number of children: Not on file  . Years of education: Not on file  . Highest education level: Not on file  Occupational History  . Not on file  Social Needs  . Financial resource strain: Not on file  . Food insecurity    Worry: Not on file    Inability: Not on file  . Transportation needs    Medical: Not on file    Non-medical: Not on file  Tobacco Use  . Smoking status: Former Smoker    Packs/day: 0.25    Types: Cigarettes    Quit date: 05/15/2015    Years since quitting: 3.8  . Smokeless tobacco: Never Used  Substance and Sexual Activity  . Alcohol use: Yes    Alcohol/week: 5.0 standard drinks    Types: 5 Standard drinks or equivalent per week  . Drug use: No  . Sexual activity: Not on file  Lifestyle  . Physical activity    Days per week: Not on file    Minutes per session: Not on file  . Stress: Not on file  Relationships  . Social Herbalist on phone: Not on file    Gets together: Not on file    Attends religious service: Not on file    Active member of club or organization: Not on file    Attends meetings of clubs or organizations: Not on file    Relationship status: Not on file  . Intimate partner violence    Fear of current or ex partner: Not on file    Emotionally abused: Not on file    Physically abused: Not on file    Forced sexual activity: Not on file  Other Topics Concern  . Not on file  Social History Narrative  . Not on file      PHYSICAL EXAM  Vitals:   03/23/19 0725  BP: 128/78  Pulse: 98  Temp: (!) 97.1 F (36.2 C)  TempSrc: Oral  Weight: 198 lb 3.2 oz (89.9 kg)  Height:  5\' 8"  (1.727 m)   Body mass index is 30.14 kg/m.  Generalized: Well developed, in no acute distress  Cardiology: normal rate and rhythm, no murmur noted Respiratory: Clear to auscultation bilaterally Neurological examination  Mentation:  Alert oriented to time, place, history taking. Follows all commands speech and language fluent Cranial nerve II-XII: Pupils were equal round reactive to light. Extraocular movements were full, visual field were full on confrontational test.  Motor: The motor testing reveals 5 over 5 strength of all 4 extremities. Good symmetric motor tone is noted throughout.   Gait and station: Gait is normal.    DIAGNOSTIC DATA (LABS, IMAGING, TESTING) - I reviewed patient records, labs, notes, testing and imaging myself where available.  No flowsheet data found.   Lab Results  Component Value Date   WBC 9.0 11/01/2018   HGB 14.4 11/01/2018   HCT 42.7 11/01/2018   MCV 87 11/01/2018   PLT 321 11/01/2018      Component Value Date/Time   NA 141 11/01/2018 0842   K 4.0 11/01/2018 0842   CL 102 11/01/2018 0842   CO2 21 11/01/2018 0842   GLUCOSE 113 (H) 11/01/2018 0842   BUN 19 11/01/2018 0842   CREATININE 1.03 11/01/2018 0842   CALCIUM 9.5 11/01/2018 0842   PROT 6.5 11/01/2018 0842   ALBUMIN 4.6 11/01/2018 0842   AST 17 11/01/2018 0842   ALT 31 11/01/2018 0842   ALKPHOS 69 11/01/2018 0842   BILITOT 0.5 11/01/2018 0842   GFRNONAA 80 11/01/2018 0842   GFRAA 92 11/01/2018 0842   Lab Results  Component Value Date   CHOL 176 11/01/2018   HDL 51 11/01/2018   LDLCALC 88 11/01/2018   TRIG 183 (H) 11/01/2018   CHOLHDL 3.5 11/01/2018   Lab Results  Component Value Date   HGBA1C 5.4 08/31/2017   Lab Results  Component Value Date   VITAMINB12 260 11/01/2018   Lab Results  Component Value Date   TSH 2.180 11/01/2018    ASSESSMENT AND PLAN 58 y.o. year old male  has a past medical history of Arthritis, GERD (gastroesophageal reflux disease), History  of BPH, Hyperlipidemia, and Hypertension. here with     ICD-10-CM   1. OSA on CPAP  G47.33 For home use only DME continuous positive airway pressure (CPAP)   Z99.89     Overall Tolga is adjusting fairly well to CPAP therapy at home.  He does note concerns of air leaking around his nasal mask and feels that air escape through his mouth.  We will send an order to [facility] for mask refitting.  We have also discussed using a chinstrap.  I have educated him on monitoring CPAP machine for greenlight to indicate appropriate fit. We have reviewed risk of untreated sleep apnea.  He was encouraged to continue working towards a goal of nightly use and greater than 4 hours each night.  He will follow-up with me in 3 months, sooner if needed.  He verbalizes understanding and agreement with this plan.   Orders Placed This Encounter  Procedures  . For home use only DME continuous positive airway pressure (CPAP)    Mask refitting please, patient may need chin strap with nasal mask.    Order Specific Question:   Length of Need    Answer:   Lifetime    Order Specific Question:   Patient has OSA or probable OSA    Answer:   Yes    Order Specific Question:   Is the patient currently using CPAP in the home    Answer:   Yes    Order Specific Question:   Settings    Answer:   Other see comments    Order Specific Question:  CPAP supplies needed    Answer:   Mask, headgear, cushions, filters, heated tubing and water chamber     No orders of the defined types were placed in this encounter.     I spent 15 minutes with the patient. 50% of this time was spent counseling and educating patient on plan of care and medications.    Debbora Presto, FNP-C 03/23/2019, 7:59 AM [facility] [address] [place], [place] [address] [phone]  I reviewed the above note and documentation by the Nurse Practitioner and agree with the history, exam, assessment and plan as outlined above. I was available  for consultation. Star Age, MD, PhD [facility])

## 2019-03-22 NOTE — Patient Instructions (Addendum)
Please continue working towards goal of nightly CPAP use and for a minimum of 4 hours each night  Ensure mask and headgear are snug, look for green light on CPAP machine  Follow up in 3 months  Sleep Apnea Sleep apnea affects breathing during sleep. It causes breathing to stop for a short time or to become shallow. It can also increase the risk of:  Heart attack.  Stroke.  Being very overweight (obese).  Diabetes.  Heart failure.  Irregular heartbeat. The goal of treatment is to help you breathe normally again. What are the causes? There are three kinds of sleep apnea:  Obstructive sleep apnea. This is caused by a blocked or collapsed airway.  Central sleep apnea. This happens when the brain does not send the right signals to the muscles that control breathing.  Mixed sleep apnea. This is a combination of obstructive and central sleep apnea. The most common cause of this condition is a collapsed or blocked airway. This can happen if:  Your throat muscles are too relaxed.  Your tongue and tonsils are too large.  You are overweight.  Your airway is too small. What increases the risk?  Being overweight.  Smoking.  Having a small airway.  Being older.  Being male.  Drinking alcohol.  Taking medicines to calm yourself (sedatives or tranquilizers).  Having family members with the condition. What are the signs or symptoms?  Trouble staying asleep.  Being sleepy or tired during the day.  Getting angry a lot.  Loud snoring.  Headaches in the morning.  Not being able to focus your mind (concentrate).  Forgetting things.  Less interest in sex.  Mood swings.  Personality changes.  Feelings of sadness (depression).  Waking up a lot during the night to pee (urinate).  Dry mouth.  Sore throat. How is this diagnosed?  Your medical history.  A physical exam.  A test that is done when you are sleeping (sleep study). The test is most often done  in a sleep lab but may also be done at home. How is this treated?   Sleeping on your side.  Using a medicine to get rid of mucus in your nose (decongestant).  Avoiding the use of alcohol, medicines to help you relax, or certain pain medicines (narcotics).  Losing weight, if needed.  Changing your diet.  Not smoking.  Using a machine to open your airway while you sleep, such as: ? An oral appliance. This is a mouthpiece that shifts your lower jaw forward. ? A CPAP device. This device blows air through a mask when you breathe out (exhale). ? An EPAP device. This has valves that you put in each nostril. ? A BPAP device. This device blows air through a mask when you breathe in (inhale) and breathe out.  Having surgery if other treatments do not work. It is important to get treatment for sleep apnea. Without treatment, it can lead to:  High blood pressure.  Coronary artery disease.  In men, not being able to have an erection (impotence).  Reduced thinking ability. Follow these instructions at home: Lifestyle  Make changes that your doctor recommends.  Eat a healthy diet.  Lose weight if needed.  Avoid alcohol, medicines to help you relax, and some pain medicines.  Do not use any products that contain nicotine or tobacco, such as cigarettes, e-cigarettes, and chewing tobacco. If you need help quitting, ask your doctor. General instructions  Take over-the-counter and prescription medicines only as told by  your doctor.  If you were given a machine to use while you sleep, use it only as told by your doctor.  If you are having surgery, make sure to tell your doctor you have sleep apnea. You may need to bring your device with you.  Keep all follow-up visits as told by your doctor. This is important. Contact a doctor if:  The machine that you were given to use during sleep bothers you or does not seem to be working.  You do not get better.  You get worse. Get help  right away if:  Your chest hurts.  You have trouble breathing in enough air.  You have an uncomfortable feeling in your back, arms, or stomach.  You have trouble talking.  One side of your body feels weak.  A part of your face is hanging down. These symptoms may be an emergency. Do not wait to see if the symptoms will go away. Get medical help right away. Call your local emergency services (911 in the [place] Do not drive yourself to the hospital. Summary  This condition affects breathing during sleep.  The most common cause is a collapsed or blocked airway.  The goal of treatment is to help you breathe normally while you sleep. This information is not intended to replace advice given to you by your health care provider. Make sure you discuss any questions you have with your health care provider. Document Released: 01/15/2008 Document Revised: 01/22/2018 Document Reviewed: 12/01/2017 Elsevier Patient Education  2020 [employer].

## 2019-03-23 ENCOUNTER — Telehealth: Payer: Self-pay

## 2019-03-23 ENCOUNTER — Ambulatory Visit: Payer: Managed Care, Other (non HMO) | Admitting: Family Medicine

## 2019-03-23 ENCOUNTER — Other Ambulatory Visit: Payer: Self-pay

## 2019-03-23 ENCOUNTER — Encounter: Payer: Self-pay | Admitting: Family Medicine

## 2019-03-23 VITALS — BP 128/78 | HR 98 | Temp 97.1°F | Ht 68.0 in | Wt 198.2 lb

## 2019-03-23 DIAGNOSIS — Z9989 Dependence on other enabling machines and devices: Secondary | ICD-10-CM

## 2019-03-23 DIAGNOSIS — G4733 Obstructive sleep apnea (adult) (pediatric): Secondary | ICD-10-CM

## 2019-03-23 NOTE — Telephone Encounter (Signed)
Faxed over DME CPAP supplies to [facility], Per NP Amy Lomax.  DME company has 2 fax numbers. Sent to both.   Received a confirmation fax back form both fax numbers.

## 2019-05-04 ENCOUNTER — Ambulatory Visit: Payer: Managed Care, Other (non HMO) | Admitting: Family Medicine

## 2019-05-24 ENCOUNTER — Other Ambulatory Visit: Payer: Self-pay | Admitting: Family Medicine

## 2019-05-30 ENCOUNTER — Other Ambulatory Visit: Payer: Self-pay

## 2019-05-31 ENCOUNTER — Ambulatory Visit: Payer: Managed Care, Other (non HMO) | Admitting: Family Medicine

## 2019-05-31 ENCOUNTER — Encounter: Payer: Self-pay | Admitting: Family Medicine

## 2019-05-31 VITALS — BP 129/80 | HR 88 | Temp 98.7°F | Ht 68.0 in | Wt 199.0 lb

## 2019-05-31 DIAGNOSIS — M7712 Lateral epicondylitis, left elbow: Secondary | ICD-10-CM

## 2019-05-31 DIAGNOSIS — Z9989 Dependence on other enabling machines and devices: Secondary | ICD-10-CM

## 2019-05-31 DIAGNOSIS — M7711 Lateral epicondylitis, right elbow: Secondary | ICD-10-CM

## 2019-05-31 DIAGNOSIS — Z0000 Encounter for general adult medical examination without abnormal findings: Secondary | ICD-10-CM

## 2019-05-31 DIAGNOSIS — Z1211 Encounter for screening for malignant neoplasm of colon: Secondary | ICD-10-CM

## 2019-05-31 DIAGNOSIS — I10 Essential (primary) hypertension: Secondary | ICD-10-CM | POA: Diagnosis not present

## 2019-05-31 DIAGNOSIS — E7800 Pure hypercholesterolemia, unspecified: Secondary | ICD-10-CM

## 2019-05-31 DIAGNOSIS — G4733 Obstructive sleep apnea (adult) (pediatric): Secondary | ICD-10-CM | POA: Insufficient documentation

## 2019-05-31 NOTE — Patient Instructions (Addendum)
Ice the areas on your elbows, use Voltaren gel up to 4 times daily.  Do the physical therapy exercises I gave you.  You had labs performed today.  You will be contacted with the results of the labs once they are available, usually in the next 3 business days for routine lab work.  If you have an active my chart account, they will be released to your MyChart.  If you prefer to have these labs released to you via telephone, please let us know.  If you had a pap smear or biopsy performed, expect to be contacted in about 7-10 days.  Preventive Care 31-28 Years Old, Male Preventive care refers to lifestyle choices and visits with your health care provider that can promote health and wellness. This includes:  A yearly physical exam. This is also called an annual well check.  Regular dental and eye exams.  Immunizations.  Screening for certain conditions.  Healthy lifestyle choices, such as eating a healthy diet, getting regular exercise, not using drugs or products that contain nicotine and tobacco, and limiting alcohol use. What can I expect for my preventive care visit? Physical exam Your health care provider will check:  Height and weight. These may be used to calculate body mass index (BMI), which is a measurement that tells if you are at a healthy weight.  Heart rate and blood pressure.  Your skin for abnormal spots. Counseling Your health care provider may ask you questions about:  Alcohol, tobacco, and drug use.  Emotional well-being.  Home and relationship well-being.  Sexual activity.  Eating habits.  Work and work Statistician. What immunizations do I need?  Influenza (flu) vaccine  This is recommended every year. Tetanus, diphtheria, and pertussis (Tdap) vaccine  You may need a Td booster every 10 years. Varicella (chickenpox) vaccine  You may need this vaccine if you have not already been vaccinated. Zoster (shingles) vaccine  You may need this after age  18. Measles, mumps, and rubella (MMR) vaccine  You may need at least one dose of MMR if you were born in 1957 or later. You may also need a second dose. Pneumococcal conjugate (PCV13) vaccine  You may need this if you have certain conditions and were not previously vaccinated. Pneumococcal polysaccharide (PPSV23) vaccine  You may need one or two doses if you smoke cigarettes or if you have certain conditions. Meningococcal conjugate (MenACWY) vaccine  You may need this if you have certain conditions. Hepatitis A vaccine  You may need this if you have certain conditions or if you travel or work in places where you may be exposed to hepatitis A. Hepatitis B vaccine  You may need this if you have certain conditions or if you travel or work in places where you may be exposed to hepatitis B. Haemophilus influenzae type b (Hib) vaccine  You may need this if you have certain risk factors. Human papillomavirus (HPV) vaccine  If recommended by your health care provider, you may need three doses over 6 months. You may receive vaccines as individual doses or as more than one vaccine together in one shot (combination vaccines). Talk with your health care provider about the risks and benefits of combination vaccines. What tests do I need? Blood tests  Lipid and cholesterol levels. These may be checked every 5 years, or more frequently if you are over 33 years old.  Hepatitis C test.  Hepatitis B test. Screening  Lung cancer screening. You may have this screening every year  starting at age 38 if you have a 30-pack-year history of smoking and currently smoke or have quit within the past 15 years.  Prostate cancer screening. Recommendations will vary depending on your family history and other risks.  Colorectal cancer screening. All adults should have this screening starting at age 36 and continuing until age 41. Your health care provider may recommend screening at age 25 if you are at  increased risk. You will have tests every 1-10 years, depending on your results and the type of screening test.  Diabetes screening. This is done by checking your blood sugar (glucose) after you have not eaten for a while (fasting). You may have this done every 1-3 years.  Sexually transmitted disease (STD) testing. Follow these instructions at home: Eating and drinking  Eat a diet that includes fresh fruits and vegetables, whole grains, lean protein, and low-fat dairy products.  Take vitamin and mineral supplements as recommended by your health care provider.  Do not drink alcohol if your health care provider tells you not to drink.  If you drink alcohol: ? Limit how much you have to 0-2 drinks a day. ? Be aware of how much alcohol is in your drink. In the [place] one drink equals one 12 oz bottle of beer (355 mL), one 5 oz glass of wine (148 mL), or one 1 oz glass of hard liquor (44 mL). Lifestyle  Take daily care of your teeth and gums.  Stay active. Exercise for at least 30 minutes on 5 or more days each week.  Do not use any products that contain nicotine or tobacco, such as cigarettes, e-cigarettes, and chewing tobacco. If you need help quitting, ask your health care provider.  If you are sexually active, practice safe sex. Use a condom or other form of protection to prevent STIs (sexually transmitted infections).  Talk with your health care provider about taking a low-dose aspirin every day starting at age 8. What's next?  Go to your health care provider once a year for a well check visit.  Ask your health care provider how often you should have your eyes and teeth checked.  Stay up to date on all vaccines. This information is not intended to replace advice given to you by your health care provider. Make sure you discuss any questions you have with your health care provider. Document Revised: 04/01/2018 Document Reviewed: 04/01/2018 Elsevier Patient Education  2020  [employer].

## 2019-05-31 NOTE — Progress Notes (Signed)
Subjective: CC: f/u HTN, HLD PCP: Janora Norlander, DO [id number] Alec Gutierrez is a 59 y.o. male presenting to clinic today for:  1.  Hypertension with hyperlipidemia Patient reports compliance with Norvasc 5 mg daily, hydrochlorothiazide 25 mg daily, Lipitor 20 mg daily.  No chest pain, shortness of breath, lower extremity edema or dizziness.  2.  Elbow pain Patient reports bilateral elbow pain.  He points to the lateral aspect of the elbows and notes that it seems intermittent but when it occurs it is quite uncomfortable.  He has been working out with a new machine where he has been doing Charity fundraiser.  He attributes this to possibly that workout.  He has been using meloxicam daily but this does not seem to be helping much.  Does not report any preceding injury, discoloration, sensation changes or weakness.  3.  Obstructive sleep apnea Patient diagnosed with severe obstructive sleep apnea last year.  He reports compliance with CPAP and notes that it does seem to help quite a bit with his energy and sleeping.  He no longer has nocturia.  He does note though since the settings were adjusted his sleep is not as good as it was.  He wonders if this should be adjusted further.  ROS: Per HPI.  10 point review of systems otherwise negative  No Known Allergies Past Medical History:  Diagnosis Date  . Arthritis   . GERD (gastroesophageal reflux disease)   . History of BPH   . Hyperlipidemia   . Hypertension     Current Outpatient Medications:  .  amLODipine (NORVASC) 5 MG tablet, Take 1 tablet (5 mg total) by mouth daily., Disp: 90 tablet, Rfl: 3 .  atorvastatin (LIPITOR) 20 MG tablet, Take 1 tablet (20 mg total) by mouth daily., Disp: 90 tablet, Rfl: 3 .  buPROPion HCl ER, XL, 450 MG TB24, Take 450 mg by mouth daily., Disp: 90 tablet, Rfl: 1 .  hydrochlorothiazide (HYDRODIURIL) 25 MG tablet, TAKE ONE TABLET BY MOUTH DAILY, Disp: 90 tablet, Rfl: 2 .  meloxicam (MOBIC) 15 MG tablet,  Take 1 tablet (15 mg total) by mouth daily., Disp: 90 tablet, Rfl: 1 .  omeprazole (PRILOSEC) 40 MG capsule, Take 1 capsule (40 mg total) by mouth daily., Disp: 90 capsule, Rfl: 1 Social History   Socioeconomic History  . Marital status: Divorced    Spouse name: Not on file  . Number of children: Not on file  . Years of education: Not on file  . Highest education level: Not on file  Occupational History  . Not on file  Tobacco Use  . Smoking status: Former Smoker    Packs/day: 0.25    Types: Cigarettes    Quit date: 05/15/2015    Years since quitting: 4.0  . Smokeless tobacco: Never Used  Substance and Sexual Activity  . Alcohol use: Yes    Alcohol/week: 5.0 standard drinks    Types: 5 Standard drinks or equivalent per week  . Drug use: No  . Sexual activity: Not on file  Other Topics Concern  . Not on file  Social History Narrative  . Not on file   Social Determinants of Health   Financial Resource Strain:   . Difficulty of Paying Living Expenses: Not on file  Food Insecurity:   . Worried About Charity fundraiser in the Last Year: Not on file  . Ran Out of Food in the Last Year: Not on file  Transportation Needs:   .  Lack of Transportation (Medical): Not on file  . Lack of Transportation (Non-Medical): Not on file  Physical Activity:   . Days of Exercise per Week: Not on file  . Minutes of Exercise per Session: Not on file  Stress:   . Feeling of Stress : Not on file  Social Connections:   . Frequency of Communication with Friends and Family: Not on file  . Frequency of Social Gatherings with Friends and Family: Not on file  . Attends Religious Services: Not on file  . Active Member of Clubs or Organizations: Not on file  . Attends Archivist Meetings: Not on file  . Marital Status: Not on file  Intimate Partner Violence:   . Fear of Current or Ex-Partner: Not on file  . Emotionally Abused: Not on file  . Physically Abused: Not on file  . Sexually  Abused: Not on file   Family History  Problem Relation Age of Onset  . Cancer Mother   . Heart disease Father   . Stroke Father     Objective: Office vital signs reviewed. BP 129/80   Pulse 88   Temp 98.7 F (37.1 C) (Temporal)   Ht 5\' 8"  (1.727 m)   Wt 199 lb (90.3 kg)   SpO2 98%   BMI 30.26 kg/m   Physical Examination:  General: Awake, alert, well nourished, No acute distress HEENT: Normal, sclera white, TMs intact bilaterally.  Oropharynx without erythema or masses.  No carotid bruits. Cardio: regular rate and rhythm, S1S2 heard, no murmurs appreciated Pulm: clear to auscultation bilaterally, no wheezes, rhonchi or rales; normal work of breathing on room air Extremities: warm, well perfused, No edema, cyanosis or clubbing; +2 pulses bilaterally GI: Soft, nontender, nondistended.  Protuberant.  He has diastases recti notable.  Bowel sounds present x4. MSK: normal gait and station; bilateral hands with arthritic changes.  He has point tenderness to the lateral epicondyles left greater than right.  No gross swelling, no warmth.  No erythema. Skin: dry; intact; no rashes or lesions; normal temperature  Neuro: No focal neurologic deficits Psych: Mood stable, speech normal, affect flat. Depression screen [facility] 2/9 11/01/2018 07/12/2018 06/09/2018  Decreased Interest 0 3 0  Down, Depressed, Hopeless 0 2 0  PHQ - 2 Score 0 5 0  Altered sleeping 0 3 -  Tired, decreased energy 0 3 -  Change in appetite 0 0 -  Feeling bad or failure about yourself  0 0 -  Trouble concentrating 0 0 -  Moving slowly or fidgety/restless 0 0 -  Suicidal thoughts 0 0 -  PHQ-9 Score 0 11 -  Difficult doing work/chores - - -  Some recent data might be hidden   Assessment/ Plan: 59 y.o. male   1. Annual physical exam We will plan to schedule for colonoscopy  2. Essential hypertension Controlled.  Continue current regimen  3. OSA on CPAP Compliant with CPAP.  May need adjustment.  Will defer this  to his sleep specialist  4. Pure hypercholesterolemia Continue statin  5. Lateral epicondylitis of both elbows We discussed home care including home physical therapy, ice, use of Voltaren gel.  May continue meloxicam.  Would consider adding Tylenol.  If no significant improvement, could consider formal eval by physical therapy versus having him see his orthopedist again.  6. Screen for colon cancer I have referred him back to gastroenterology.  He sees [facility] but is greater than 1 year overdue for his colonoscopy - Ambulatory referral to Gastroenterology  No orders of the defined types were placed in this encounter.  No orders of the defined types were placed in this encounter.    Janora Norlander, DO [place] [phone]

## 2019-06-01 ENCOUNTER — Telehealth: Payer: Self-pay | Admitting: Family Medicine

## 2019-06-01 ENCOUNTER — Encounter: Payer: Self-pay | Admitting: Gastroenterology

## 2019-06-01 NOTE — Telephone Encounter (Signed)
Pt has called to relay that when he called last the cpap was blowing out too much air.  Pt states he now isnt feeling that well and feels the CPAP may need another adjustment, please call

## 2019-06-02 NOTE — Telephone Encounter (Signed)
Patient called in and stated he received a call and a voicemail but no message was left

## 2019-06-02 NOTE — Telephone Encounter (Signed)
Attempted to return call. No answer.  Left a VM for him to call us back.   Wanted to get more information regarding his CPAP machine.  I will go a head and route this message to the provider, so that they are aware.

## 2019-06-02 NOTE — Telephone Encounter (Signed)
Thank you for calling. I will keep an eye out for further information. Please pull CPAP download for review as well.

## 2019-06-06 NOTE — Telephone Encounter (Signed)
Attempted to call the pt, again to get more information regarding the CPAP machine.  No answer left a VM.

## 2019-06-10 ENCOUNTER — Other Ambulatory Visit: Payer: Self-pay | Admitting: Family Medicine

## 2019-06-13 ENCOUNTER — Other Ambulatory Visit: Payer: Self-pay

## 2019-06-13 ENCOUNTER — Ambulatory Visit (AMBULATORY_SURGERY_CENTER): Payer: Self-pay | Admitting: *Deleted

## 2019-06-13 VITALS — Temp 98.1°F | Ht 68.0 in | Wt 198.0 lb

## 2019-06-13 DIAGNOSIS — Z1211 Encounter for screening for malignant neoplasm of colon: Secondary | ICD-10-CM

## 2019-06-13 DIAGNOSIS — Z01818 Encounter for other preprocedural examination: Secondary | ICD-10-CM

## 2019-06-13 MED ORDER — NA SULFATE-K SULFATE-MG SULF 17.5-3.13-1.6 GM/177ML PO SOLN: ORAL | 0 refills | Status: DC

## 2019-06-13 NOTE — Progress Notes (Signed)
Patient is here in-person for PV. Patient denies any allergies to eggs or soy. Patient denies any problems with anesthesia/sedation. Patient denies any oxygen use at home. Patient denies taking any diet/weight loss medications or blood thinners. Patient is not being treated for MRSA or C-diff.   COVID-19 screening test is on 3/4, the pt is aware. Pt is aware that care partner will wait in the car during procedure; if they feel like they will be too hot or cold to wait in the car; they may wait in the 4 th floor lobby. Patient is aware to bring only one care partner. We want them to wear a mask (we do not have any that we can provide them), practice social distancing, and we will check their temperatures when they get here.  I did remind the patient that their care partner needs to stay in the parking lot the entire time and have a cell phone available, we will call them when the pt is ready for discharge. Patient will wear mask into building.    Suprep $15 off coupon given to the patient.

## 2019-06-15 ENCOUNTER — Telehealth: Payer: Self-pay | Admitting: Family Medicine

## 2019-06-15 NOTE — Telephone Encounter (Signed)
Pt called back and stated that he mentioned in the past that he works 3rd shift and is needing to be called before noon. He states all the calls he has been getting in return are later than that and this is why no one is ever able to get a hold of him. Please call pt as soon as possible and before noon.

## 2019-06-15 NOTE — Telephone Encounter (Signed)
There are tests that can be performed at home but it depends on what his risk for colon cancer is.  Given history of rectal bleeding in past, I would ask that he call the gastroenterologist for this answer.  If they think he can do Cologuard instead of colonoscopy, I'm sure they would oblige that.

## 2019-06-15 NOTE — Telephone Encounter (Signed)
Pt was able to get back in touch with [place] before he heads back to work. States he prefers to be contacted during the AM.   Pt states that he was doing well with the 3 month review. Thereafter, not so much; patient stipulates that he is having reddening on the face, with sensitivity, and dry skin-mostly around the nose. He is unsure if there was some adjustments made or not. Patient said that "Before it was too strong, now its not strong enough." Alec Gutierrez also unsure of how to adjust humidity settings. Stipulates perhaps a different mask may help, is unsure.

## 2019-06-15 NOTE — Telephone Encounter (Signed)
Please let him know that compliance report looks great. Pressure settings look great with range of 7 to 15. He is needing about 10-12. Machine should auto titrate to his body's needs. Apnea is well managed. Leak remains elevated. Please refer him to DME for adjustment guidance with mask. Have him keep in touch to let us know how he is doing.

## 2019-06-15 NOTE — Telephone Encounter (Signed)
Called pt to attempt to get in touch with him before noon. No answer, Left a VM. I left a VM informing him that we have lunch hours at according times; So we can actually talk to him prior to him leaving for work.

## 2019-06-15 NOTE — Telephone Encounter (Signed)
Patient aware and verbalized understanding. °

## 2019-06-16 NOTE — Telephone Encounter (Signed)
Called pt no answer LVM. I explained that I will send more detailed information via MyChart. (please see providers previous note below.)   "Please let him know that compliance report looks great. Pressure settings look great with range of 7 to 15. He is needing about 10-12. Machine should auto titrate to his body's needs. Apnea is well managed. Leak remains elevated. Please refer him to DME for adjustment guidance with mask. Have him keep in touch to let us know how he is doing. "-NP AL

## 2019-06-22 ENCOUNTER — Encounter: Payer: Self-pay | Admitting: Gastroenterology

## 2019-06-23 ENCOUNTER — Other Ambulatory Visit: Payer: Self-pay | Admitting: Gastroenterology

## 2019-06-23 ENCOUNTER — Ambulatory Visit (INDEPENDENT_AMBULATORY_CARE_PROVIDER_SITE_OTHER): Payer: Managed Care, Other (non HMO)

## 2019-06-23 DIAGNOSIS — Z1159 Encounter for screening for other viral diseases: Secondary | ICD-10-CM

## 2019-06-23 LAB — SARS CORONAVIRUS 2 (TAT 6-24 HRS): SARS Coronavirus 2: NEGATIVE

## 2019-06-27 ENCOUNTER — Ambulatory Visit (AMBULATORY_SURGERY_CENTER): Payer: Managed Care, Other (non HMO) | Admitting: Gastroenterology

## 2019-06-27 ENCOUNTER — Encounter: Payer: Self-pay | Admitting: Gastroenterology

## 2019-06-27 ENCOUNTER — Ambulatory Visit: Payer: Managed Care, Other (non HMO) | Admitting: Family Medicine

## 2019-06-27 ENCOUNTER — Other Ambulatory Visit: Payer: Self-pay

## 2019-06-27 VITALS — BP 125/74 | HR 84 | Temp 97.1°F | Resp 16 | Ht 68.0 in | Wt 198.0 lb

## 2019-06-27 DIAGNOSIS — Z1211 Encounter for screening for malignant neoplasm of colon: Secondary | ICD-10-CM

## 2019-06-27 MED ORDER — SODIUM CHLORIDE 0.9 % IV SOLN: 500.0000 mL | Freq: Once | INTRAVENOUS | Status: DC

## 2019-06-27 NOTE — Progress Notes (Signed)
Temp by JB, V/S by CW   Pt's states no medical or surgical changes since previsit or office visit.

## 2019-06-27 NOTE — Patient Instructions (Signed)
Impression/Recommendations:  Diverticulosis handout given to patient.  Resume previous diet. Continue present medications.  Repeat colonoscopy in 10 years for screening.  YOU HAD AN ENDOSCOPIC PROCEDURE TODAY AT [facility]:   Refer to the procedure report that was given to you for any specific questions about what was found during the examination.  If the procedure report does not answer your questions, please call your gastroenterologist to clarify.  If you requested that your care partner not be given the details of your procedure findings, then the procedure report has been included in a sealed envelope for you to review at your convenience later.  YOU SHOULD EXPECT: Some feelings of bloating in the abdomen. Passage of more gas than usual.  Walking can help get rid of the air that was put into your GI tract during the procedure and reduce the bloating. If you had a lower endoscopy (such as a colonoscopy or flexible sigmoidoscopy) you may notice spotting of blood in your stool or on the toilet paper. If you underwent a bowel prep for your procedure, you may not have a normal bowel movement for a few days.  Please Note:  You might notice some irritation and congestion in your nose or some drainage.  This is from the oxygen used during your procedure.  There is no need for concern and it should clear up in a day or so.  SYMPTOMS TO REPORT IMMEDIATELY:   Following lower endoscopy (colonoscopy or flexible sigmoidoscopy):  Excessive amounts of blood in the stool  Significant tenderness or worsening of abdominal pains  Swelling of the abdomen that is new, acute  Fever of 100F or higher For urgent or emergent issues, a gastroenterologist can be reached at any hour by calling [phone]. Do not use MyChart messaging for urgent concerns.    DIET:  We do recommend a small meal at first, but then you may proceed to your regular diet.  Drink plenty of fluids but you should  avoid alcoholic beverages for 24 hours.  ACTIVITY:  You should plan to take it easy for the rest of today and you should NOT DRIVE or use heavy machinery until tomorrow (because of the sedation medicines used during the test).    FOLLOW UP: Our staff will call the number listed on your records 48-72 hours following your procedure to check on you and address any questions or concerns that you may have regarding the information given to you following your procedure. If we do not reach you, we will leave a message.  We will attempt to reach you two times.  During this call, we will ask if you have developed any symptoms of COVID 19. If you develop any symptoms (ie: fever, flu-like symptoms, shortness of breath, cough etc.) before then, please call [phone].  If you test positive for Covid 19 in the 2 weeks post procedure, please call and report this information to [place].    If any biopsies were taken you will be contacted by phone or by letter within the next 1-3 weeks.  Please call us at [phone] if you have not heard about the biopsies in 3 weeks.    SIGNATURES/CONFIDENTIALITY: You and/or your care partner have signed paperwork which will be entered into your electronic medical record.  These signatures attest to the fact that that the information above on your After Visit Summary has been reviewed and is understood.  Full responsibility of the confidentiality of this discharge information lies with you  and/or your care-partner. 

## 2019-06-27 NOTE — Progress Notes (Signed)
A and O x3. Report to RN. Tolerated MAC anesthesia well.

## 2019-06-27 NOTE — Op Note (Signed)
[place] Patient Name: Alec Gutierrez Procedure Date: 06/27/2019 8:22 AM MRN: [id number] Endoscopist: Mallie Mussel L. Loletha Carrow , MD Age: 59 Referring MD:  Date of Birth: 1960/09/07 Gender: Male Account #: [id number] Procedure:                Colonoscopy Indications:              Screening for colorectal malignant neoplasm (no                            polyps on colonoscopy for heme positive stool in                            01/2008) Medicines:                Monitored Anesthesia Care Procedure:                Pre-Anesthesia Assessment:                           - Prior to the procedure, a History and Physical                            was performed, and patient medications and                            allergies were reviewed. The patient's tolerance of                            previous anesthesia was also reviewed. The risks                            and benefits of the procedure and the sedation                            options and risks were discussed with the patient.                            All questions were answered, and informed consent                            was obtained. Prior Anticoagulants: The patient has                            taken no previous anticoagulant or antiplatelet                            agents. ASA Grade Assessment: II - A patient with                            mild systemic disease. After reviewing the risks                            and benefits, the patient was deemed in  satisfactory condition to undergo the procedure.                           After obtaining informed consent, the colonoscope                            was passed under direct vision. Throughout the                            procedure, the patient's blood pressure, pulse, and                            oxygen saturations were monitored continuously. The                            Colonoscope was introduced through the anus and                             advanced to the the cecum, identified by                            appendiceal orifice and ileocecal valve. The                            colonoscopy was performed without difficulty. The                            patient tolerated the procedure well. The quality                            of the bowel preparation was excellent. The                            ileocecal valve, appendiceal orifice, and rectum                            were photographed. Scope In: 8:30:28 AM Scope Out: 8:43:39 AM Scope Withdrawal Time: 0 hours 8 minutes 15 seconds  Total Procedure Duration: 0 hours 13 minutes 11 seconds  Findings:                 The perianal and digital rectal examinations were                            normal.                           Multiple diverticula were found in the sigmoid                            colon.                           The exam was otherwise without abnormality on  direct and retroflexion views. Complications:            No immediate complications. Estimated Blood Loss:     Estimated blood loss: none. Impression:               - Diverticulosis in the sigmoid colon.                           - The examination was otherwise normal on direct                            and retroflexion views.                           - No specimens collected. Recommendation:           - Patient has a contact number available for                            emergencies. The signs and symptoms of potential                            delayed complications were discussed with the                            patient. Return to normal activities tomorrow.                            Written discharge instructions were provided to the                            patient.                           - Resume previous diet.                           - Continue present medications.                           - Repeat colonoscopy in 10 years for screening                             purposes. Solace Wendorff L. Loletha Carrow, MD 06/27/2019 8:46:17 AM This report has been signed electronically.

## 2019-06-29 ENCOUNTER — Telehealth: Payer: Self-pay

## 2019-06-29 NOTE — Telephone Encounter (Signed)
  Follow up Call-  Call back number 06/27/2019  Post procedure Call Back phone  # [phone]  Permission to leave phone message Yes  Some recent data might be hidden     Patient questions:  Do you have a fever, pain , or abdominal swelling? No. Pain Score  0 *  Have you tolerated food without any problems? Yes.    Have you been able to return to your normal activities? Yes.    Do you have any questions about your discharge instructions: Diet   No. Medications  No. Follow up visit  No.  Do you have questions or concerns about your Care? No.  Actions: * If pain score is 4 or above: 1. No action needed, pain <4.Have you developed a fever since your procedure? no  2.   Have you had an respiratory symptoms (SOB or cough) since your procedure? no  3.   Have you tested positive for COVID 19 since your procedure no  4.   Have you had any family members/close contacts diagnosed with the COVID 19 since your procedure?  no   If yes to any of these questions please route to Joylene John, RN and Alphonsa Gin, Therapist, sports.

## 2019-08-14 NOTE — Patient Instructions (Addendum)
Please continue using your CPAP regularly. While your insurance requires that you use CPAP at least 4 hours each night on 70% of the nights, I recommend, that you not skip any nights and use it throughout the night if you can. Getting used to CPAP and staying with the treatment long term does take time and patience and discipline. Untreated obstructive sleep apnea when it is moderate to severe can have an adverse impact on cardiovascular health and raise her risk for heart disease, arrhythmias, hypertension, congestive heart failure, stroke and diabetes. Untreated obstructive sleep apnea causes sleep disruption, nonrestorative sleep, and sleep deprivation. This can have an impact on your day to day functioning and cause daytime sleepiness and impairment of cognitive function, memory loss, mood disturbance, and problems focussing. Using CPAP regularly can improve these symptoms.  DME: Alec [id number] [phone]  We will repeat download in 6-8 weeks and will call to schedule follow up pending report. Please continue close communication with [facility] for any concerns with machine or supplies.    Sleep Apnea Sleep apnea affects breathing during sleep. It causes breathing to stop for a short time or to become shallow. It can also increase the risk of:  Heart attack.  Stroke.  Being very overweight (obese).  Diabetes.  Heart failure.  Irregular heartbeat. The goal of treatment is to help you breathe normally again. What are the causes? There are three kinds of sleep apnea:  Obstructive sleep apnea. This is caused by a blocked or collapsed airway.  Central sleep apnea. This happens when the brain does not send the right signals to the muscles that control breathing.  Mixed sleep apnea. This is a combination of obstructive and central sleep apnea. The most common cause of this condition is a collapsed or blocked airway. This can happen if:  Your throat muscles are too relaxed.  Your tongue and  tonsils are too large.  You are overweight.  Your airway is too small. What increases the risk?  Being overweight.  Smoking.  Having a small airway.  Being older.  Being male.  Drinking alcohol.  Taking medicines to calm yourself (sedatives or tranquilizers).  Having family members with the condition. What are the signs or symptoms?  Trouble staying asleep.  Being sleepy or tired during the day.  Getting angry a lot.  Loud snoring.  Headaches in the morning.  Not being able to focus your mind (concentrate).  Forgetting things.  Less interest in sex.  Mood swings.  Personality changes.  Feelings of sadness (depression).  Waking up a lot during the night to pee (urinate).  Dry mouth.  Sore throat. How is this diagnosed?  Your medical history.  A physical exam.  A test that is done when you are sleeping (sleep study). The test is most often done in a sleep lab but may also be done at home. How is this treated?   Sleeping on your side.  Using a medicine to get rid of mucus in your nose (decongestant).  Avoiding the use of alcohol, medicines to help you relax, or certain pain medicines (narcotics).  Losing weight, if needed.  Changing your diet.  Not smoking.  Using a machine to open your airway while you sleep, such as: ? An oral appliance. This is a mouthpiece that shifts your lower jaw forward. ? A CPAP device. This device blows air through a mask when you breathe out (exhale). ? An EPAP device. This has valves that you put in each nostril. ?  A BPAP device. This device blows air through a mask when you breathe in (inhale) and breathe out.  Having surgery if other treatments do not work. It is important to get treatment for sleep apnea. Without treatment, it can lead to:  High blood pressure.  Coronary artery disease.  In men, not being able to have an erection (impotence).  Reduced thinking ability. Follow these instructions at  home: Lifestyle  Make changes that your doctor recommends.  Eat a healthy diet.  Lose weight if needed.  Avoid alcohol, medicines to help you relax, and some pain medicines.  Do not use any products that contain nicotine or tobacco, such as cigarettes, e-cigarettes, and chewing tobacco. If you need help quitting, ask your doctor. General instructions  Take over-the-counter and prescription medicines only as told by your doctor.  If you were given a machine to use while you sleep, use it only as told by your doctor.  If you are having surgery, make sure to tell your doctor you have sleep apnea. You may need to bring your device with you.  Keep all follow-up visits as told by your doctor. This is important. Contact a doctor if:  The machine that you were given to use during sleep bothers you or does not seem to be working.  You do not get better.  You get worse. Get help right away if:  Your chest hurts.  You have trouble breathing in enough air.  You have an uncomfortable feeling in your back, arms, or stomach.  You have trouble talking.  One side of your body feels weak.  A part of your face is hanging down. These symptoms may be an emergency. Do not wait to see if the symptoms will go away. Get medical help right away. Call your local emergency services (911 in the [place] Do not drive yourself to the hospital. Summary  This condition affects breathing during sleep.  The most common cause is a collapsed or blocked airway.  The goal of treatment is to help you breathe normally while you sleep. This information is not intended to replace advice given to you by your health care provider. Make sure you discuss any questions you have with your health care provider. Document Revised: 01/22/2018 Document Reviewed: 12/01/2017 Elsevier Patient Education  [place].

## 2019-08-14 NOTE — Progress Notes (Addendum)
PATIENT: Alec Gutierrez DOB: 17-May-1960  REASON FOR VISIT: follow up HISTORY FROM: patient  Chief Complaint  Patient presents with  . Follow-up    cpap fu, rm 5, alone , pt states cpap is not working well for him      HISTORY OF PRESENT ILLNESS: Today 08/15/19 Alec Gutierrez is a 59 y.o. male here today for follow up for OSA on CPAP. Leak was noted with last follow up.  We sent orders for mask refitting, however, patient reports that he did not receive a new mask.  He did receive a new headgear set but reports it is now stretched out in the Velcro is worn out.  He has used this headgear for the past 5 months.  He does state that he has received several additional mask in the mail but has not switched these out.  He feels that his current mask is uncomfortable and causing tenderness around the bridge of the nose and under his eyes.  Compliance report dated 07/12/2019 through 08/10/2019 reveals that he used CPAP 22 of the past 30 days for compliance of 73%.  A CPAP greater than 4 hours 17 of the past 30 days for compliance of 87%.  Average usage was 6 hours and 5 minutes.  Residual AHI was 1.1 on 7 to 15 cm of water and an EPR of 1.  He continues to have a significant leak in the 95th percentile of 40.4.  HISTORY: (copied from my note on 03/23/2019)  [name] is a 59 y.o. male here today for follow up for OSA on CPAP.  He reports that overall, he is adjusting fairly well.  He works third shift.  He notes that there are times, specifically on the weekend, where he will fall asleep and not place CPAP mask.  He is using a nasal mask and is concerned that air is escaping through his mouth.  He does note increased energy levels after using CPAP.  Compliance report dated 02/20/2019 through 03/21/2019 reveals that he used CPAP 24 of the last 30 days for compliance of 80%.  20 days he used CPAP greater than 4 hours for compliance of 57%.  Average usage was 5 hours and 49 minutes.  Residual AHI was  6.0 on 7 to 15 cm of water and an EPR of 1.  Pressure in the 95th percentile of 12.1.  There was a significant leak noted in the 95th percentile of 43.9.  REVIEW OF SYSTEMS: Out of a complete 14 system review of symptoms, the patient complains only of the following symptoms, none and all other reviewed systems are negative.  ESS: 17 FSS: 46  ALLERGIES: No Known Allergies  HOME MEDICATIONS: Outpatient Medications Prior to Visit  Medication Sig Dispense Refill  . amLODipine (NORVASC) 5 MG tablet TAKE ONE TABLET BY MOUTH DAILY 90 tablet 2  . atorvastatin (LIPITOR) 20 MG tablet TAKE ONE TABLET BY MOUTH DAILY 90 tablet 2  . buPROPion HCl ER, XL, 450 MG TB24 Take 450 mg by mouth daily. 90 tablet 1  . hydrochlorothiazide (HYDRODIURIL) 25 MG tablet TAKE ONE TABLET BY MOUTH DAILY 90 tablet 2  . meloxicam (MOBIC) 15 MG tablet Take 1 tablet (15 mg total) by mouth daily. 90 tablet 1  . omeprazole (PRILOSEC) 40 MG capsule Take 1 capsule (40 mg total) by mouth daily. 90 capsule 1   No facility-administered medications prior to visit.    PAST MEDICAL HISTORY: Past Medical History:  Diagnosis Date  .  Arthritis   . GERD (gastroesophageal reflux disease)   . History of BPH   . Hyperlipidemia   . Hypertension   . Sleep apnea    uses CPAP     PAST SURGICAL HISTORY: Past Surgical History:  Procedure Laterality Date  . COLONOSCOPY  02/09/2008  . EYE SURGERY    . HERNIA REPAIR     inguinal and abd    FAMILY HISTORY: Family History  Problem Relation Age of Onset  . Cancer Mother 20       unsure pt thinks it was colon cancer  . Colon cancer Mother   . Heart disease Father   . Stroke Father   . Esophageal cancer Neg Hx   . Rectal cancer Neg Hx   . Stomach cancer Neg Hx   . Colon polyps Neg Hx     SOCIAL HISTORY: Social History   Socioeconomic History  . Marital status: Divorced    Spouse name: Not on file  . Number of children: Not on file  . Years of education: Not on file  .  Highest education level: Not on file  Occupational History  . Not on file  Tobacco Use  . Smoking status: Former Smoker    Packs/day: 0.25    Types: Cigarettes    Quit date: 05/15/2015    Years since quitting: 4.2  . Smokeless tobacco: Never Used  Substance and Sexual Activity  . Alcohol use: Yes    Alcohol/week: 49.0 standard drinks    Types: 49 Cans of beer per week  . Drug use: No  . Sexual activity: Not on file  Other Topics Concern  . Not on file  Social History Narrative  . Not on file   Social Determinants of Health   Financial Resource Strain:   . Difficulty of Paying Living Expenses:   Food Insecurity:   . Worried About Charity fundraiser in the Last Year:   . Arboriculturist in the Last Year:   Transportation Needs:   . Film/video editor (Medical):   Marland Kitchen Lack of Transportation (Non-Medical):   Physical Activity:   . Days of Exercise per Week:   . Minutes of Exercise per Session:   Stress:   . Feeling of Stress :   Social Connections:   . Frequency of Communication with Friends and Family:   . Frequency of Social Gatherings with Friends and Family:   . Attends Religious Services:   . Active Member of Clubs or Organizations:   . Attends Archivist Meetings:   Marland Kitchen Marital Status:   Intimate Partner Violence:   . Fear of Current or Ex-Partner:   . Emotionally Abused:   Marland Kitchen Physically Abused:   . Sexually Abused:       PHYSICAL EXAM  Vitals:   08/15/19 0719  BP: 133/84  Pulse: 86  Temp: (!) 97.3 F (36.3 C)  Weight: 198 lb (89.8 kg)  Height: 5\' 8"  (1.727 m)   Body mass index is 30.11 kg/m.  Generalized: Well developed, in no acute distress  Cardiology: normal rate and rhythm, no murmur noted Respiratory: clear to auscultation bilaterally  Neurological examination  Mentation: Alert oriented to time, place, history taking. Follows all commands speech and language fluent Cranial nerve II-XII: Pupils were equal round reactive to light.  Extraocular movements were full, visual field were full  Motor: The motor testing reveals 5 over 5 strength of all 4 extremities. Good symmetric motor tone is noted throughout.  Gait and station: Gait is normal.   DIAGNOSTIC DATA (LABS, IMAGING, TESTING) - I reviewed patient records, labs, notes, testing and imaging myself where available.  No flowsheet data found.   Lab Results  Component Value Date   WBC 9.0 11/01/2018   HGB 14.4 11/01/2018   HCT 42.7 11/01/2018   MCV 87 11/01/2018   PLT 321 11/01/2018      Component Value Date/Time   NA 141 11/01/2018 0842   K 4.0 11/01/2018 0842   CL 102 11/01/2018 0842   CO2 21 11/01/2018 0842   GLUCOSE 113 (H) 11/01/2018 0842   BUN 19 11/01/2018 0842   CREATININE 1.03 11/01/2018 0842   CALCIUM 9.5 11/01/2018 0842   PROT 6.5 11/01/2018 0842   ALBUMIN 4.6 11/01/2018 0842   AST 17 11/01/2018 0842   ALT 31 11/01/2018 0842   ALKPHOS 69 11/01/2018 0842   BILITOT 0.5 11/01/2018 0842   GFRNONAA 80 11/01/2018 0842   GFRAA 92 11/01/2018 0842   Lab Results  Component Value Date   CHOL 176 11/01/2018   HDL 51 11/01/2018   LDLCALC 88 11/01/2018   TRIG 183 (H) 11/01/2018   CHOLHDL 3.5 11/01/2018   Lab Results  Component Value Date   HGBA1C 5.4 08/31/2017   Lab Results  Component Value Date   VITAMINB12 260 11/01/2018   Lab Results  Component Value Date   TSH 2.180 11/01/2018     ASSESSMENT AND PLAN 59 y.o. year old male  has a past medical history of Arthritis, GERD (gastroesophageal reflux disease), History of BPH, Hyperlipidemia, Hypertension, and Sleep apnea. here with     ICD-10-CM   1. OSA on CPAP  G47.33 For home use only DME continuous positive airway pressure (CPAP)   Z99.89     Mr. Mcclave continues to have difficulty with his mask and headgear at home.  He has not reached out to the DME company to address concerns.  He also admits that he has used the same mask and headgear for the past 5 months.  We have discussed  compliance report.  He meets 73% compliance pieces standpoint but only 57% compliance from a 4-hour usage.  Elevated leak continues to be a concern.  He is currently using a nasal mask.  I will send orders to [facility] today for a mask refitting.  I have provided the patient with direct contact information for [employer] and asked that he contact them for a mask refitting.  We have also discussed how to monitor for the correct mask seal on his CPAP machine at home.  We have discussed risk of untreated sleep apnea and reviewed his previous home sleep study.  He does wish to continue CPAP therapy.  He was encouraged to use CPAP nightly and for greater than 4 hours each night.  I will repeat download in 6 to 8 weeks to assess leak.  We will determine follow-up at that point.  Patient verbalizes understanding and agreement with this plan.   Orders Placed This Encounter  Procedures  . For home use only DME continuous positive airway pressure (CPAP)    Please assist patient with possible mask refitting. He continues to have leak with nasal mask. I have educated him on the need for replacement of supplies every 3 months.    Order Specific Question:   Length of Need    Answer:   Lifetime    Order Specific Question:   Patient has OSA or probable OSA    Answer:  Yes    Order Specific Question:   Is the patient currently using CPAP in the home    Answer:   Yes    Order Specific Question:   Settings    Answer:   Other see comments    Order Specific Question:   CPAP supplies needed    Answer:   Mask, headgear, cushions, filters, heated tubing and water chamber     No orders of the defined types were placed in this encounter.     I spent 25 minutes with the patient. 50% of this time was spent counseling and educating patient on plan of care and medications.     Debbora Presto, FNP-C 08/15/2019, 8:08 AM [facility] [address] [place], [place] [address] [phone]   I reviewed the  above note and documentation by the Nurse Practitioner and agree with the history, exam, assessment and plan Alec outlined above. I was available for consultation. Star Age, MD, PhD [facility])

## 2019-08-15 ENCOUNTER — Other Ambulatory Visit: Payer: Self-pay

## 2019-08-15 ENCOUNTER — Encounter: Payer: Self-pay | Admitting: Family Medicine

## 2019-08-15 ENCOUNTER — Ambulatory Visit: Payer: Managed Care, Other (non HMO) | Admitting: Family Medicine

## 2019-08-15 VITALS — BP 133/84 | HR 86 | Temp 97.3°F | Ht 68.0 in | Wt 198.0 lb

## 2019-08-15 DIAGNOSIS — G4733 Obstructive sleep apnea (adult) (pediatric): Secondary | ICD-10-CM

## 2019-08-15 DIAGNOSIS — Z9989 Dependence on other enabling machines and devices: Secondary | ICD-10-CM | POA: Diagnosis not present

## 2019-08-30 ENCOUNTER — Encounter: Payer: Self-pay | Admitting: *Deleted

## 2019-10-07 ENCOUNTER — Other Ambulatory Visit: Payer: Self-pay | Admitting: Family Medicine

## 2019-10-07 DIAGNOSIS — F329 Major depressive disorder, single episode, unspecified: Secondary | ICD-10-CM

## 2019-10-07 DIAGNOSIS — K219 Gastro-esophageal reflux disease without esophagitis: Secondary | ICD-10-CM

## 2019-10-07 DIAGNOSIS — M159 Polyosteoarthritis, unspecified: Secondary | ICD-10-CM

## 2019-10-12 ENCOUNTER — Other Ambulatory Visit: Payer: Self-pay

## 2019-10-12 ENCOUNTER — Encounter: Payer: Self-pay | Admitting: Physician Assistant

## 2019-10-12 ENCOUNTER — Ambulatory Visit: Payer: Managed Care, Other (non HMO) | Admitting: Physician Assistant

## 2019-10-12 VITALS — BP 129/82 | HR 97 | Temp 98.0°F | Ht 68.0 in | Wt 197.6 lb

## 2019-10-12 DIAGNOSIS — M79674 Pain in right toe(s): Secondary | ICD-10-CM | POA: Diagnosis not present

## 2019-10-12 DIAGNOSIS — M25511 Pain in right shoulder: Secondary | ICD-10-CM

## 2019-10-12 NOTE — Patient Instructions (Signed)
Shoulder Pain Many things can cause shoulder pain, including:  An injury.  Moving the shoulder in the same way again and again (overuse).  Joint pain (arthritis). Pain can come from:  Swelling and irritation (inflammation) of any part of the shoulder.  An injury to the shoulder joint.  An injury to: ? Tissues that connect muscle to bone (tendons). ? Tissues that connect bones to each other (ligaments). ? Bones. Follow these instructions at home: Watch for changes in your symptoms. Let your doctor know about them. Follow these instructions to help with your pain. If you have a sling:  Wear the sling as told by your doctor. Remove it only as told by your doctor.  Loosen the sling if your fingers: ? Tingle. ? Become numb. ? Turn cold and blue.  Keep the sling clean.  If the sling is not waterproof: ? Do not let it get wet. ? Take the sling off when you shower or bathe. Managing pain, stiffness, and swelling   If told, put ice on the painful area: ? Put ice in a plastic bag. ? Place a towel between your skin and the bag. ? Leave the ice on for 20 minutes, 2-3 times a day. Stop putting ice on if it does not help with the pain.  Squeeze a soft ball or a foam pad as much as possible. This prevents swelling in the shoulder. It also helps to strengthen the arm. General instructions  Take over-the-counter and prescription medicines only as told by your doctor.  Keep all follow-up visits as told by your doctor. This is important. Contact a doctor if:  Your pain gets worse.  Medicine does not help your pain.  You have new pain in your arm, hand, or fingers. Get help right away if:  Your arm, hand, or fingers: ? Tingle. ? Are numb. ? Are swollen. ? Are painful. ? Turn white or blue. Summary  Shoulder pain can be caused by many things. These include injury, moving the shoulder in the same away again and again, and joint pain.  Watch for changes in your symptoms.  Let your doctor know about them.  This condition may be treated with a sling, ice, and pain medicine.  Contact your doctor if the pain gets worse or you have new pain. Get help right away if your arm, hand, or fingers tingle or get numb, swollen, or painful.  Keep all follow-up visits as told by your doctor. This is important. This information is not intended to replace advice given to you by your health care provider. Make sure you discuss any questions you have with your health care provider. Document Revised: 10/20/2017 Document Reviewed: 10/20/2017 Elsevier Patient Education  2020 [employer]  

## 2019-10-12 NOTE — Progress Notes (Signed)
  Subjective:     Patient ID: Alec Gutierrez, male   DOB: 1961/02/11, 59 y.o.   MRN: [id number]  HPI  Pt here with 2 complaints #1- Pt working out on the [employer] ~ 1 month ago and felt pop to the R shoulder Now with intermit R shoulder pain Only occurs in certain positions Denies any radicular sx/numbness Pain to posterior shoulder #2- Pt cutting toenails and the file went under the R great toenail Did have some bleeding at the time Pt with pain when nail pressed down Review of Systems  Constitutional: Negative.   Musculoskeletal: Positive for arthralgias and myalgias. Negative for joint swelling, neck pain and neck stiffness.  Skin: Negative.        Objective:   Physical Exam Vitals and nursing note reviewed.  Constitutional:      General: He is not in acute distress.    Appearance: Normal appearance. He is not toxic-appearing.  Neurological:     Mental Status: He is alert.    No TTP of the entire R shoulder FROM of the R shoulder Only sl sx with restricted lateral abduction Good strength No sx with int/ext rotation FROM of the R elbow and wrist w/o sx Grip/sensory good and equal bilat R great toenail thickened No surrounding edema, erythema, or induration noted FROM of the toe No drainage noted    Assessment:     1. Acute pain of right shoulder   2. Pain in right toe(s)        Plan:     For the toe alerted to S/S of infection F/U immed if they present Ease back in to his work out OTC meds for sx F/U prn

## 2019-11-28 ENCOUNTER — Ambulatory Visit: Payer: Managed Care, Other (non HMO) | Admitting: Family Medicine

## 2019-12-02 ENCOUNTER — Ambulatory Visit: Payer: Managed Care, Other (non HMO) | Admitting: Family Medicine

## 2019-12-02 ENCOUNTER — Encounter: Payer: Self-pay | Admitting: Family Medicine

## 2019-12-02 ENCOUNTER — Other Ambulatory Visit: Payer: Self-pay

## 2019-12-02 VITALS — BP 131/87 | HR 99 | Temp 97.7°F | Ht 68.0 in | Wt 195.0 lb

## 2019-12-02 DIAGNOSIS — G4733 Obstructive sleep apnea (adult) (pediatric): Secondary | ICD-10-CM

## 2019-12-02 DIAGNOSIS — I10 Essential (primary) hypertension: Secondary | ICD-10-CM

## 2019-12-02 DIAGNOSIS — F329 Major depressive disorder, single episode, unspecified: Secondary | ICD-10-CM | POA: Diagnosis not present

## 2019-12-02 DIAGNOSIS — E7800 Pure hypercholesterolemia, unspecified: Secondary | ICD-10-CM

## 2019-12-02 DIAGNOSIS — K219 Gastro-esophageal reflux disease without esophagitis: Secondary | ICD-10-CM

## 2019-12-02 DIAGNOSIS — Z9989 Dependence on other enabling machines and devices: Secondary | ICD-10-CM

## 2019-12-02 DIAGNOSIS — M159 Polyosteoarthritis, unspecified: Secondary | ICD-10-CM

## 2019-12-02 DIAGNOSIS — Z130 Encounter for screening for diseases of the blood and blood-forming organs and certain disorders involving the immune mechanism: Secondary | ICD-10-CM

## 2019-12-02 DIAGNOSIS — M8949 Other hypertrophic osteoarthropathy, multiple sites: Secondary | ICD-10-CM

## 2019-12-02 DIAGNOSIS — Z125 Encounter for screening for malignant neoplasm of prostate: Secondary | ICD-10-CM

## 2019-12-02 DIAGNOSIS — M7581 Other shoulder lesions, right shoulder: Secondary | ICD-10-CM

## 2019-12-02 MED ORDER — AMLODIPINE BESYLATE 5 MG PO TABS: 5.0000 mg | ORAL_TABLET | Freq: Every day | ORAL | 3 refills | Status: DC

## 2019-12-02 MED ORDER — OMEPRAZOLE 40 MG PO CPDR: 40.0000 mg | DELAYED_RELEASE_CAPSULE | Freq: Every day | ORAL | 3 refills | Status: DC

## 2019-12-02 MED ORDER — MELOXICAM 15 MG PO TABS: 15.0000 mg | ORAL_TABLET | Freq: Every day | ORAL | 3 refills | Status: DC

## 2019-12-02 MED ORDER — BUPROPION HCL ER (XL) 450 MG PO TB24: 1.0000 | ORAL_TABLET | Freq: Every day | ORAL | 3 refills | Status: DC

## 2019-12-02 MED ORDER — HYDROCHLOROTHIAZIDE 25 MG PO TABS: 25.0000 mg | ORAL_TABLET | Freq: Every day | ORAL | 3 refills | Status: DC

## 2019-12-02 MED ORDER — ATORVASTATIN CALCIUM 20 MG PO TABS: 20.0000 mg | ORAL_TABLET | Freq: Every day | ORAL | 3 refills | Status: DC

## 2019-12-02 NOTE — Progress Notes (Signed)
Subjective: CC: HTN, HLD, Depression, OSA PCP: Janora Norlander, DO Alec Gutierrez is a 59 y.o. male presenting to clinic today for:  1.  Depression Patient reports compliance with Wellbutrin XL 450 mg daily.  Does not report any anxiety or depressive symptoms today  2.  Hypertension with hyperlipidemia, OSA Patient reports compliance with Norvasc 5 mg daily, hydrochlorothiazide 25 mg daily and Lipitor 20 mg daily.  No chest pain, shortness of breath, headache, lower extreme edema.  3.  Right shoulder pain Patient has ongoing right shoulder pain that he sustained when he was working out on an exercise machine.  Certain positions do tend to cause sharp pains in that right shoulder.  He does not report any weakness, sensory changes.  He is unable to lay on that right side.  He takes meloxicam at baseline for arthritis and was told at his previous visit to take another NSAID for as needed pain.  He does not take the ibuprofen much because he does not have the pain very often.   ROS: Per HPI  No Known Allergies Past Medical History:  Diagnosis Date   Arthritis    GERD (gastroesophageal reflux disease)    History of BPH    Hyperlipidemia    Hypertension    Sleep apnea    uses CPAP     Current Outpatient Medications:    amLODipine (NORVASC) 5 MG tablet, TAKE ONE TABLET BY MOUTH DAILY, Disp: 90 tablet, Rfl: 2   atorvastatin (LIPITOR) 20 MG tablet, TAKE ONE TABLET BY MOUTH DAILY, Disp: 90 tablet, Rfl: 2   buPROPion HCl ER, XL, 450 MG TB24, TAKE ONE TABLET BY MOUTH DAILY, Disp: 90 tablet, Rfl: 0   hydrochlorothiazide (HYDRODIURIL) 25 MG tablet, TAKE ONE TABLET BY MOUTH DAILY, Disp: 90 tablet, Rfl: 2   meloxicam (MOBIC) 15 MG tablet, TAKE ONE TABLET BY MOUTH DAILY, Disp: 90 tablet, Rfl: 0   omeprazole (PRILOSEC) 40 MG capsule, TAKE ONE CAPSULE BY MOUTH DAILY, Disp: 90 capsule, Rfl: 0 Social History   Socioeconomic History   Marital status: Divorced    Spouse  name: Not on file   Number of children: Not on file   Years of education: Not on file   Highest education level: Not on file  Occupational History   Not on file  Tobacco Use   Smoking status: Former Smoker    Packs/day: 0.25    Types: Cigarettes    Quit date: 05/15/2015    Years since quitting: 4.5   Smokeless tobacco: Never Used  Vaping Use   Vaping Use: Never used  Substance and Sexual Activity   Alcohol use: Yes    Alcohol/week: 49.0 standard drinks    Types: 49 Cans of beer per week   Drug use: No   Sexual activity: Not on file  Other Topics Concern   Not on file  Social History Narrative   Not on file   Social Determinants of Health   Financial Resource Strain:    Difficulty of Paying Living Expenses:   Food Insecurity:    Worried About Charity fundraiser in the Last Year:    Arboriculturist in the Last Year:   Transportation Needs:    Film/video editor (Medical):    Lack of Transportation (Non-Medical):   Physical Activity:    Days of Exercise per Week:    Minutes of Exercise per Session:   Stress:    Feeling of Stress :  Social Connections:    Frequency of Communication with Friends and Family:    Frequency of Social Gatherings with Friends and Family:    Attends Religious Services:    Active Member of Clubs or Organizations:    Attends Music therapist:    Marital Status:   Intimate Partner Violence:    Fear of Current or Ex-Partner:    Emotionally Abused:    Physically Abused:    Sexually Abused:    Family History  Problem Relation Age of Onset   Cancer Mother 71       unsure pt thinks it was colon cancer   Colon cancer Mother    Heart disease Father    Stroke Father    Esophageal cancer Neg Hx    Rectal cancer Neg Hx    Stomach cancer Neg Hx    Colon polyps Neg Hx     Objective: Office vital signs reviewed. BP 131/87    Pulse 99    Temp 97.7 F (36.5 C) (Temporal)    Ht 5' 8"  (1.727 m)    Wt 195 lb (88.5 kg)    SpO2 97%    BMI 29.65 kg/m   Physical Examination:  General: Awake, alert, well nourished, No acute distress HEENT: Normal, sclera white, MMM Cardio: regular rate and rhythm, S1S2 heard, no murmurs appreciated Pulm: clear to auscultation bilaterally, no wheezes, rhonchi or rales; normal work of breathing on room air Extremities: warm, well perfused, No edema, cyanosis or clubbing; +2 pulses bilaterally MSK: normal gait and  Station  RUE: He has full active range of motion in all planes.  No tenderness palpation to the shoulder girdle.  He does have pain with empty can testing.  Negative Hawkins Skin: dry; intact; no rashes or lesions Neuro: 5/5 UE Strength and light touch sensation grossly intact  Psych: Mood stable, speech normal, affect appropriate, pleasant and interactive Depression screen [facility] 2/9 12/02/2019 10/12/2019 05/31/2019  Decreased Interest 0 0 0  Down, Depressed, Hopeless 0 0 0  PHQ - 2 Score 0 0 0  Altered sleeping 0 - 0  Tired, decreased energy 1 - 0  Change in appetite 0 - 0  Feeling bad or failure about yourself  0 - 0  Trouble concentrating 0 - 0  Moving slowly or fidgety/restless 0 - 0  Suicidal thoughts 0 - 0  PHQ-9 Score 1 - 0  Difficult doing work/chores - - -  Some recent data might be hidden   Assessment/ Plan: 59 y.o. male   1. Essential hypertension Controlled.  Continue current regimen - CMP14+EGFR  2. Pure hypercholesterolemia Check fasting lipid panel - CMP14+EGFR - Lipid Panel - TSH  3. OSA on CPAP All stable  4. Reactive depression Stable - TSH - buPROPion HCl ER, XL, 450 MG TB24; Take 1 tablet by mouth daily.  Dispense: 90 tablet; Refill: 3  5. Screening, anemia, deficiency, iron - CBC  6. Screening for malignant neoplasm of prostate - PSA  7. Primary osteoarthritis involving multiple joints Advised against all other oral NSAIDs while taking meloxicam.  Topical Voltaren okay if needed.  Tylenol  okay. - meloxicam (MOBIC) 15 MG tablet; Take 1 tablet (15 mg total) by mouth daily. (IF needed for pain)  Dispense: 90 tablet; Refill: 3  8. Gastroesophageal reflux disease without esophagitis - omeprazole (PRILOSEC) 40 MG capsule; Take 1 capsule (40 mg total) by mouth daily.  Dispense: 90 capsule; Refill: 3  9. Tendinitis of right rotator cuff Suspect  tendinitis not tear given maintain strength during today's exam.  I have given her home physical therapy exercises for rotator cuff.  If persistent despite use of oral NSAID as above, topical and physical therapy, plan for corticosteroid injection.  He will contact me if this is needed   No orders of the defined types were placed in this encounter.  No orders of the defined types were placed in this encounter.    Janora Norlander, DO [place] [phone]

## 2019-12-03 LAB — CBC
Hematocrit: 41.6 % (ref 37.5–51.0)
Hemoglobin: 14.2 g/dL (ref 13.0–17.7)
MCH: 29.6 pg (ref 26.6–33.0)
MCHC: 34.1 g/dL (ref 31.5–35.7)
MCV: 87 fL (ref 79–97)
Platelets: 289 10*3/uL (ref 150–450)
RBC: 4.8 x10E6/uL (ref 4.14–5.80)
RDW: 12.5 % (ref 11.6–15.4)
WBC: 8.8 10*3/uL (ref 3.4–10.8)

## 2019-12-03 LAB — CMP14+EGFR
ALT: 25 IU/L (ref 0–44)
AST: 13 IU/L (ref 0–40)
Albumin/Globulin Ratio: 2 (ref 1.2–2.2)
Albumin: 4.5 g/dL (ref 3.8–4.9)
Alkaline Phosphatase: 68 IU/L (ref 48–121)
BUN/Creatinine Ratio: 14 (ref 10–24)
BUN: 15 mg/dL (ref 8–27)
Bilirubin Total: 0.6 mg/dL (ref 0.0–1.2)
CO2: 26 mmol/L (ref 20–29)
Calcium: 9.5 mg/dL (ref 8.6–10.2)
Chloride: 97 mmol/L (ref 96–106)
Creatinine, Ser: 1.05 mg/dL (ref 0.76–1.27)
GFR calc Af Amer: 89 mL/min/{1.73_m2} (ref >59)
GFR calc non Af Amer: 77 mL/min/{1.73_m2} (ref >59)
Globulin, Total: 2.3 g/dL (ref 1.5–4.5)
Glucose: 103 mg/dL — ABNORMAL HIGH (ref 65–99)
Potassium: 3.9 mmol/L (ref 3.5–5.2)
Sodium: 136 mmol/L (ref 134–144)
Total Protein: 6.8 g/dL (ref 6.0–8.5)

## 2019-12-03 LAB — LIPID PANEL
Chol/HDL Ratio: 3.5 ratio (ref 0.0–5.0)
Cholesterol, Total: 193 mg/dL (ref 100–199)
HDL: 55 mg/dL (ref >39)
LDL Chol Calc (NIH): 111 mg/dL — ABNORMAL HIGH (ref 0–99)
Triglycerides: 154 mg/dL — ABNORMAL HIGH (ref 0–149)
VLDL Cholesterol Cal: 27 mg/dL (ref 5–40)

## 2019-12-03 LAB — PSA: Prostate Specific Ag, Serum: 1.4 ng/mL (ref 0.0–4.0)

## 2019-12-03 LAB — TSH: TSH: 1.89 u[IU]/mL (ref 0.450–4.500)

## 2019-12-07 ENCOUNTER — Telehealth: Payer: Self-pay | Admitting: Family Medicine

## 2019-12-07 NOTE — Telephone Encounter (Signed)
Patient aware will have to show verification from vaccine

## 2019-12-07 NOTE — Telephone Encounter (Signed)
covid shot information Moderna 1 does 08/29/2019 2 dose 09/27/2019

## 2019-12-13 ENCOUNTER — Other Ambulatory Visit: Payer: Self-pay

## 2019-12-13 ENCOUNTER — Telehealth: Payer: Self-pay | Admitting: Family Medicine

## 2019-12-13 MED ORDER — ATORVASTATIN CALCIUM 40 MG PO TABS: 40.0000 mg | ORAL_TABLET | Freq: Every day | ORAL | 0 refills | Status: DC

## 2019-12-13 NOTE — Telephone Encounter (Signed)
Pt states he figured it out-regarding rx for lipitor. Will close encounter.

## 2020-02-02 ENCOUNTER — Ambulatory Visit (INDEPENDENT_AMBULATORY_CARE_PROVIDER_SITE_OTHER): Payer: Managed Care, Other (non HMO) | Admitting: Family Medicine

## 2020-02-02 ENCOUNTER — Encounter: Payer: Self-pay | Admitting: Family Medicine

## 2020-02-02 DIAGNOSIS — Z20822 Contact with and (suspected) exposure to covid-19: Secondary | ICD-10-CM | POA: Diagnosis not present

## 2020-02-02 NOTE — Progress Notes (Signed)
Telephone visit  Subjective: AN:VBTYOM PCP: Janora Norlander, DO AYO:KHTXH Alec Gutierrez is a 59 y.o. male calls for telephone consult today. Patient provides verbal consent for consult held via phone.  Due to COVID-19 pandemic this visit was conducted virtually. This visit type was conducted due to national recommendations for restrictions regarding the COVID-19 Pandemic (e.g. social distancing, sheltering in place) in an effort to limit this patient's exposure and mitigate transmission in our community. All issues noted in this document were discussed and addressed.  A physical exam was not performed with this format.   Location of patient: home Location of provider: [facility] Others present for call: none  1.  Nausea and headache Patient woke up last night very nauseated and has a headache.  He had a COVID positive exposure at work.  He was told to quarantine.  He has had his vaccines.  He notes that the nausea has totally resolved.  He still has a mild headache.  Does not report any other concerning symptoms including fever, cough, congestion, shortness of breath or body aches.  ROS: Per HPI  No Known Allergies Past Medical History:  Diagnosis Date   Arthritis    GERD (gastroesophageal reflux disease)    History of BPH    Hyperlipidemia    Hypertension    Sleep apnea    uses CPAP     Current Outpatient Medications:    amLODipine (NORVASC) 5 MG tablet, Take 1 tablet (5 mg total) by mouth daily., Disp: 90 tablet, Rfl: 3   atorvastatin (LIPITOR) 40 MG tablet, Take 1 tablet (40 mg total) by mouth daily., Disp: 90 tablet, Rfl: 0   buPROPion HCl ER, XL, 450 MG TB24, Take 1 tablet by mouth daily., Disp: 90 tablet, Rfl: 3   hydrochlorothiazide (HYDRODIURIL) 25 MG tablet, Take 1 tablet (25 mg total) by mouth daily., Disp: 90 tablet, Rfl: 3   meloxicam (MOBIC) 15 MG tablet, Take 1 tablet (15 mg total) by mouth daily. (IF needed for pain), Disp: 90 tablet, Rfl: 3   omeprazole  (PRILOSEC) 40 MG capsule, Take 1 capsule (40 mg total) by mouth daily., Disp: 90 capsule, Rfl: 3  Assessment/ Plan: 59 y.o. male   1. Contact with and (suspected) exposure to covid-19 He has been vaccinated.  Symptoms are improving.  Only headache remaining.  I have asked that he check his blood pressure.  He will call him at 2 PM to have this swab done.  Anticipate he will not be able to return to work until earliest Monday since this is usually 2 to 3-day turnaround lab.  I will give him a work note to have on hand if needed - Novel Coronavirus, [facility] ([facility])   Start time: 12:59pm End time: 1:03pm  Total time spent on patient care (including telephone call/ virtual visit): 7 minutes  [place], [place] [phone]

## 2020-02-03 LAB — SARS-COV-2, NAA 2 DAY TAT

## 2020-02-03 LAB — NOVEL CORONAVIRUS, NAA: SARS-CoV-2, NAA: NOT DETECTED

## 2020-03-27 ENCOUNTER — Telehealth: Payer: Self-pay

## 2020-03-27 NOTE — Telephone Encounter (Signed)
Alec Gutierrez states that he received information from his insurance that they no longer are covering his current medication and he needs to speak with Dr. Lajuana Ripple ASAP in order to resolve. He works 3rd shift and would like someone to call him Wednesday morning before 12 noon if at all possible. Thank you!

## 2020-03-28 NOTE — Telephone Encounter (Signed)
Spoke to pt.  Needs current wellbutrin switched to 150mg  tab #3/d due to ins no longer covering 450mg .  He will contact me when he is nearing the end of current supply in 2 months.

## 2020-03-30 ENCOUNTER — Telehealth: Payer: Self-pay

## 2020-03-30 NOTE — Telephone Encounter (Signed)
Spoke to pt and reviewed results and recommendations.

## 2020-03-30 NOTE — Telephone Encounter (Signed)
Pt needs last lab results and he doesn't have MyChart and doesn't know how to use a computer. So please call pt at [phone].

## 2020-04-20 ENCOUNTER — Other Ambulatory Visit: Payer: Self-pay | Admitting: Family Medicine

## 2020-06-01 ENCOUNTER — Telehealth: Payer: Self-pay

## 2020-06-01 MED ORDER — BUPROPION HCL ER (XL) 150 MG PO TB24: 450.0000 mg | ORAL_TABLET | Freq: Every day | ORAL | 3 refills | Status: DC

## 2020-06-01 NOTE — Telephone Encounter (Signed)
sent 

## 2020-06-01 NOTE — Telephone Encounter (Signed)
Patient aware and verbalized understanding. °

## 2020-08-12 ENCOUNTER — Other Ambulatory Visit: Payer: Self-pay | Admitting: Family Medicine

## 2020-09-18 ENCOUNTER — Ambulatory Visit: Payer: Managed Care, Other (non HMO) | Admitting: Nurse Practitioner

## 2020-09-18 ENCOUNTER — Encounter: Payer: Self-pay | Admitting: Nurse Practitioner

## 2020-09-18 VITALS — BP 136/94 | HR 115 | Temp 98.3°F | Resp 20 | Ht 68.0 in | Wt 195.0 lb

## 2020-09-18 DIAGNOSIS — J069 Acute upper respiratory infection, unspecified: Secondary | ICD-10-CM | POA: Diagnosis not present

## 2020-09-18 MED ORDER — BENZONATATE 100 MG PO CAPS: 100.0000 mg | ORAL_CAPSULE | Freq: Three times a day (TID) | ORAL | 0 refills | Status: DC | PRN

## 2020-09-18 MED ORDER — PREDNISONE 20 MG PO TABS: 40.0000 mg | ORAL_TABLET | Freq: Every day | ORAL | 0 refills | Status: AC

## 2020-09-18 NOTE — Patient Instructions (Signed)
Upper Respiratory Infection, Adult An upper respiratory infection (URI) is a common viral infection of the nose, throat, and upper air passages that lead to the lungs. The most common type of URI is the common cold. URIs usually get better on their own, without medical treatment. What are the causes? A URI is caused by a virus. You may catch a virus by:  Breathing in droplets from an infected person's cough or sneeze.  Touching something that has been exposed to the virus (contaminated) and then touching your mouth, nose, or eyes. What increases the risk? You are more likely to get a URI if:  You are very young or very old.  It is autumn or winter.  You have close contact with others, such as at a daycare, school, or health care facility.  You smoke.  You have long-term (chronic) heart or lung disease.  You have a weakened disease-fighting (immune) system.  You have nasal allergies or asthma.  You are experiencing a lot of stress.  You work in an area that has poor air circulation.  You have poor nutrition. What are the signs or symptoms? A URI usually involves some of the following symptoms:  Runny or stuffy (congested) nose.  Sneezing.  Cough.  Sore throat.  Headache.  Fatigue.  Fever.  Loss of appetite.  Pain in your forehead, behind your eyes, and over your cheekbones (sinus pain).  Muscle aches.  Redness or irritation of the eyes.  Pressure in the ears or face. How is this diagnosed? This condition may be diagnosed based on your medical history and symptoms, and a physical exam. Your health care provider may use a cotton swab to take a mucus sample from your nose (nasal swab). This sample can be tested to determine what virus is causing the illness. How is this treated? URIs usually get better on their own within 7-10 days. You can take steps at home to relieve your symptoms. Medicines cannot cure URIs, but your health care provider may recommend  certain medicines to help relieve symptoms, such as:  Over-the-counter cold medicines.  Cough suppressants. Coughing is a type of defense against infection that helps to clear the respiratory system, so take these medicines only as recommended by your health care provider.  Fever-reducing medicines. Follow these instructions at home: Activity  Rest as needed.  If you have a fever, stay home from work or school until your fever is gone or until your health care provider says you are no longer contagious. Your health care provider may have you wear a face mask to prevent your infection from spreading. Relieving symptoms  Gargle with a salt-water mixture 3-4 times a day or as needed. To make a salt-water mixture, completely dissolve -1 tsp of salt in 1 cup of warm water.  Use a cool-mist humidifier to add moisture to the air. This can help you breathe more easily. Eating and drinking  Drink enough fluid to keep your urine pale yellow.  Eat soups and other clear broths.   General instructions  Take over-the-counter and prescription medicines only as told by your health care provider. These include cold medicines, fever reducers, and cough suppressants.  Do not use any products that contain nicotine or tobacco, such as cigarettes and e-cigarettes. If you need help quitting, ask your health care provider.  Stay away from secondhand smoke.  Stay up to date on all immunizations, including the yearly (annual) flu vaccine.  Keep all follow-up visits as told by your health  care provider. This is important.   How to prevent the spread of infection to others  URIs can be passed from person to person (are contagious). To prevent the infection from spreading: ? Wash your hands often with soap and water. If soap and water are not available, use hand sanitizer. ? Avoid touching your mouth, face, eyes, or nose. ? Cough or sneeze into a tissue or your sleeve or elbow instead of into your hand or  into the air.   Contact a health care provider if:  You are getting worse instead of better.  You have a fever or chills.  Your mucus is brown or red.  You have yellow or brown discharge coming from your nose.  You have pain in your face, especially when you bend forward.  You have swollen neck glands.  You have pain while swallowing.  You have white areas in the back of your throat. Get help right away if:  You have shortness of breath that gets worse.  You have severe or persistent: ? Headache. ? Ear pain. ? Sinus pain. ? Chest pain.  You have chronic lung disease along with any of the following: ? Wheezing. ? Prolonged cough. ? Coughing up blood. ? A change in your usual mucus.  You have a stiff neck.  You have changes in your: ? Vision. ? Hearing. ? Thinking. ? Mood. Summary  An upper respiratory infection (URI) is a common infection of the nose, throat, and upper air passages that lead to the lungs.  A URI is caused by a virus.  URIs usually get better on their own within 7-10 days.  Medicines cannot cure URIs, but your health care provider may recommend certain medicines to help relieve symptoms. This information is not intended to replace advice given to you by your health care provider. Make sure you discuss any questions you have with your health care provider. Document Revised: 12/15/2019 Document Reviewed: 12/15/2019 Elsevier Patient Education  [place].

## 2020-09-18 NOTE — Progress Notes (Signed)
Subjective:    Patient ID: Alec Gutierrez, male    DOB: 10/27/1960, 60 y.o.   MRN: [id number]   Chief Complaint: Sinus Problem and Nausea   HPI Patient comes in today c/o nasal congestion and cough that started Friday night. Has made him nauseous. He has had no fever , chills or body aches. He has had covid vaccine X2. Has been taking benadryl and claritin.   Review of Systems  Constitutional: Negative for chills and fever.  HENT: Positive for ear pain, postnasal drip, rhinorrhea and sinus pain.   Respiratory: Positive for cough and shortness of breath (occasional).   Cardiovascular: Negative for chest pain.  Musculoskeletal: Negative for myalgias.  Neurological: Positive for headaches. Negative for dizziness.  All other systems reviewed and are negative.      Objective:   Physical Exam Vitals and nursing note reviewed.  Constitutional:      Appearance: He is obese.  HENT:     Right Ear: Tympanic membrane normal. There is no impacted cerumen.     Left Ear: Tympanic membrane normal. There is no impacted cerumen.     Nose: Congestion present. No rhinorrhea.     Mouth/Throat:     Mouth: Mucous membranes are moist.  Eyes:     Extraocular Movements: Extraocular movements intact.     Pupils: Pupils are equal, round, and reactive to light.  Cardiovascular:     Rate and Rhythm: Normal rate and regular rhythm.     Heart sounds: Normal heart sounds.  Pulmonary:     Breath sounds: Normal breath sounds.     Comments: Deep dry cough Abdominal:     Palpations: Abdomen is soft.  Skin:    General: Skin is warm.  Neurological:     General: No focal deficit present.     Mental Status: He is alert and oriented to person, place, and time.  Psychiatric:        Mood and Affect: Mood normal.        Behavior: Behavior normal.    BP (!) 136/94   Pulse (!) 115   Temp 98.3 F (36.8 C) (Temporal)   Resp 20   Ht 5\' 8"  (1.727 m)   Wt 195 lb (88.5 kg)   SpO2 98%   BMI 29.65 kg/m          Assessment & Plan:  Alec Gutierrez in today with chief complaint of Sinus Problem and Nausea   1. URI with cough and congestion 1. Take meds as prescribed 2. Use a cool mist humidifier especially during the winter months and when heat has been humid. 3. Use saline nose sprays frequently 4. Saline irrigations of the nose can be very helpful if done frequently.  * 4X daily for 1 week*  * Use of a nettie pot can be helpful with this. Follow directions with this* 5. Drink plenty of fluids 6. Keep thermostat turn down low 7.For any cough or congestion  Use plain Mucinex- regular strength or max strength is fine   * Children- consult with Pharmacist for dosing 8. For fever or aces or pains- take tylenol or ibuprofen appropriate for age and weight.  * for fevers greater than 101 orally you may alternate ibuprofen and tylenol every  3 hours.    - benzonatate (TESSALON PERLES) 100 MG capsule; Take 1 capsule (100 mg total) by mouth 3 (three) times daily as needed.  Dispense: 20 capsule; Refill: 0 - predniSONE (DELTASONE) 20 MG tablet; Take  2 tablets (40 mg total) by mouth daily with breakfast for 5 days. 2 po daily for 5 days  Dispense: 10 tablet; Refill: 0    The above assessment and management plan was discussed with the patient. The patient verbalized understanding of and has agreed to the management plan. Patient is aware to call the clinic if symptoms persist or worsen. Patient is aware when to return to the clinic for a follow-up visit. Patient educated on when it is appropriate to go to the emergency department.   Mary-Margaret Hassell Done, FNP

## 2020-11-30 ENCOUNTER — Other Ambulatory Visit: Payer: Self-pay

## 2020-11-30 ENCOUNTER — Encounter: Payer: Self-pay | Admitting: Family Medicine

## 2020-11-30 ENCOUNTER — Ambulatory Visit: Payer: Managed Care, Other (non HMO) | Admitting: Family Medicine

## 2020-11-30 VITALS — BP 141/84 | HR 89 | Temp 97.6°F | Ht 68.0 in | Wt 198.6 lb

## 2020-11-30 DIAGNOSIS — G4733 Obstructive sleep apnea (adult) (pediatric): Secondary | ICD-10-CM

## 2020-11-30 DIAGNOSIS — R4589 Other symptoms and signs involving emotional state: Secondary | ICD-10-CM

## 2020-11-30 DIAGNOSIS — I10 Essential (primary) hypertension: Secondary | ICD-10-CM

## 2020-11-30 DIAGNOSIS — E7800 Pure hypercholesterolemia, unspecified: Secondary | ICD-10-CM

## 2020-11-30 DIAGNOSIS — F32A Depression, unspecified: Secondary | ICD-10-CM

## 2020-11-30 DIAGNOSIS — F329 Major depressive disorder, single episode, unspecified: Secondary | ICD-10-CM | POA: Diagnosis not present

## 2020-11-30 DIAGNOSIS — Z9989 Dependence on other enabling machines and devices: Secondary | ICD-10-CM

## 2020-11-30 DIAGNOSIS — M19049 Primary osteoarthritis, unspecified hand: Secondary | ICD-10-CM

## 2020-11-30 MED ORDER — CELECOXIB 100 MG PO CAPS: 100.0000 mg | ORAL_CAPSULE | Freq: Two times a day (BID) | ORAL | 12 refills | Status: DC | PRN

## 2020-11-30 NOTE — Progress Notes (Signed)
Subjective: CC:HTN/ HLD PCP: Janora Norlander, DO BDZ:HGDJM Alec Gutierrez is a 60 y.o. male presenting to clinic today for:  1. Hypertension, hyperlipidemia:  Patient reports compliance with Norvasc 5 mg daily, Lipitor 40 mg daily, hydrochlorothiazide 25 mg daily.  No chest pain, shortness of breath.  He does report polyarthritis particularly in the hands but notes that sometimes his legs are sore and ache as well.  He uses the meloxicam but is not sure that this is quite effective.  See below  Last flu, zoster and/or pneumovax:  Immunization History  Administered Date(s) Administered   Influenza Whole 02/12/2012   Influenza,inj,Quad PF,6+ Mos 04/20/2013, 01/30/2015, 03/10/2016, 01/15/2017, 01/18/2019   Influenza-Unspecified 04/04/2014, 01/31/2018   Moderna SARS-COV2 Booster Vaccination 03/30/2020   Moderna Sars-Covid-2 Vaccination 08/29/2019, 09/27/2019   Tdap 12/14/2006, 01/31/2018   Zoster Recombinat (Shingrix) 05/20/2018   Zoster, Live 02/20/2015    2.  Polyarthralgia Patient takes meloxicam as above.  He notes that his wrists and hands are the most painful but he especially has pain in the right pointer finger at the [facility].  He does not want to see anyone for injections but would be open to alternative treatment.  Sometimes when he works overhead for prolonged amount of time he will get some numbness in the arms.  No reports of weakness.  He is right-hand dominant   3 Depression Patient is not sure if the Wellbutrin extended release form from 50 mg daily is helpful.  He admits to some anhedonia and loneliness.  He works third shift and "most of the folks he knows have died".  ROS: Per HPI  No Known Allergies Past Medical History:  Diagnosis Date   Arthritis    GERD (gastroesophageal reflux disease)    History of BPH    Hyperlipidemia    Hypertension    Sleep apnea    uses CPAP     Current Outpatient Medications:    amLODipine (NORVASC) 5 MG tablet, Take 1 tablet (5 mg  total) by mouth daily., Disp: 90 tablet, Rfl: 3   atorvastatin (LIPITOR) 40 MG tablet, TAKE ONE TABLET BY MOUTH DAILY, Disp: 90 tablet, Rfl: 1   buPROPion (WELLBUTRIN XL) 150 MG 24 hr tablet, Take 3 tablets (450 mg total) by mouth daily., Disp: 270 tablet, Rfl: 3   hydrochlorothiazide (HYDRODIURIL) 25 MG tablet, Take 1 tablet (25 mg total) by mouth daily., Disp: 90 tablet, Rfl: 3   meloxicam (MOBIC) 15 MG tablet, Take 1 tablet (15 mg total) by mouth daily. (IF needed for pain), Disp: 90 tablet, Rfl: 3   omeprazole (PRILOSEC) 40 MG capsule, Take 1 capsule (40 mg total) by mouth daily., Disp: 90 capsule, Rfl: 3 Social History   Socioeconomic History   Marital status: Divorced    Spouse name: Not on file   Number of children: Not on file   Years of education: Not on file   Highest education level: Not on file  Occupational History   Not on file  Tobacco Use   Smoking status: Former    Packs/day: 0.25    Types: Cigarettes    Quit date: 05/15/2015    Years since quitting: 5.5   Smokeless tobacco: Never  Vaping Use   Vaping Use: Never used  Substance and Sexual Activity   Alcohol use: Yes    Alcohol/week: 49.0 standard drinks    Types: 49 Cans of beer per week   Drug use: No   Sexual activity: Not on file  Other Topics  Concern   Not on file  Social History Narrative   Not on file   Social Determinants of Health   Financial Resource Strain: Not on file  Food Insecurity: Not on file  Transportation Needs: Not on file  Physical Activity: Not on file  Stress: Not on file  Social Connections: Not on file  Intimate Partner Violence: Not on file   Family History  Problem Relation Age of Onset   Cancer Mother 19       unsure pt thinks it was colon cancer   Colon cancer Mother    Heart disease Father    Stroke Father    Esophageal cancer Neg Hx    Rectal cancer Neg Hx    Stomach cancer Neg Hx    Colon polyps Neg Hx     Objective: Office vital signs reviewed. BP (!) 141/84    Pulse 89   Temp 97.6 F (36.4 C)   Ht _0  (1.727 m)   Wt 198 lb 9.6 oz (90.1 kg)   SpO2 98%   BMI 30.20 kg/m   Physical Examination:  General: Awake, alert, well nourished, No acute distress HEENT: Normal; sclera white.  No carotid bruits Cardio: regular rate and rhythm, S1S2 heard, no murmurs appreciated Pulm: clear to auscultation bilaterally, no wheezes, rhonchi or rales; normal work of breathing on room air MSK: Ambulating independently with normal station.  No gross joint deformities appreciated.  No warmth or erythema Psych: Mood is depressed, affect flat.  Eye contact fair.  Does not appear to be responding to internal stimuli  Depression screen [facility] 2/9 11/30/2020 12/02/2019 10/12/2019  Decreased Interest 3 0 0  Down, Depressed, Hopeless 2 0 0  PHQ - 2 Score 5 0 0  Altered sleeping 1 0 -  Tired, decreased energy 3 1 -  Change in appetite 0 0 -  Feeling bad or failure about yourself  2 0 -  Trouble concentrating 0 0 -  Moving slowly or fidgety/restless 2 0 -  Suicidal thoughts 0 0 -  PHQ-9 Score 13 1 -  Difficult doing work/chores Somewhat difficult - -  Some recent data might be hidden   GAD 7 : Generalized Anxiety Score 11/30/2020 05/03/2018 05/07/2015  Nervous, Anxious, on Edge 0 2 3  Control/stop worrying 0 0 0  Worry too much - different things 0 1 0  Trouble relaxing 0 0 0  Restless 0 0 0  Easily annoyed or irritable _1 Afraid - awful might happen 0 0 0  Total GAD 7 Score _2 Anxiety Difficulty Somewhat difficult Somewhat difficult -    Assessment/ Plan: 60 y.o. male   Essential hypertension - Plan: CMP14+EGFR  Pure hypercholesterolemia - Plan: CMP14+EGFR, Lipid Panel, TSH  OSA on CPAP  Depressed mood with feeling of loneliness  Hand arthritis - Plan: celecoxib (CELEBREX) 100 MG capsule  Blood pressure is borderline uncontrolled with a systolic blood pressure of 141.  May need to consider advancing his amlodipine.  I am going to see him back  in 3 months for recheck of this  Fasting labs were obtained.  Continue statin  Continue CPAP for OSA  Concerned about his depressed mood and feelings of loneliness.  I think that it is worth him having some counseling services.  Not sure that changing medications are appropriate at this time as it will not solve lack of social interactions.  He works alone at work and I am sure this contributes  to some of the feelings of loneliness.  I encouraged him to try and put himself out there and meet new people.  Understandably during this working third shift would be somewhat difficult but we do have a community where several workers worked third shift and he may be able to connect with some of these folks.  As far as his hand arthritis goes I would like to him to discontinue meloxicam we will start Celebrex.  Discussed use of PPI, eating with the Celebrex and drinking plenty of water.  We will reassess this again in 3 months, sooner if needed  No orders of the defined types were placed in this encounter.  No orders of the defined types were placed in this encounter.    Janora Norlander, DO [place] [phone]

## 2020-11-30 NOTE — Patient Instructions (Signed)
I think it's worth talking to a counselor about your loneliness/ mood.  I have put in a request for someone to call you and informed them you work 3rd shift.  You had labs performed today.  You will be contacted with the results of the labs once they are available, usually in the next 3 business days for routine lab work.  If you have an active my chart account, they will be released to your MyChart.  If you prefer to have these labs released to you via telephone, please let us know.  If you had a pap smear or biopsy performed, expect to be contacted in about 7-10 days.

## 2020-12-01 LAB — CMP14+EGFR
ALT: 21 IU/L (ref 0–44)
AST: 16 IU/L (ref 0–40)
Albumin/Globulin Ratio: 2.2 (ref 1.2–2.2)
Albumin: 4.2 g/dL (ref 3.8–4.8)
Alkaline Phosphatase: 60 IU/L (ref 44–121)
BUN/Creatinine Ratio: 13 (ref 10–24)
BUN: 13 mg/dL (ref 8–27)
Bilirubin Total: 0.5 mg/dL (ref 0.0–1.2)
CO2: 25 mmol/L (ref 20–29)
Calcium: 9.2 mg/dL (ref 8.6–10.2)
Chloride: 99 mmol/L (ref 96–106)
Creatinine, Ser: 0.98 mg/dL (ref 0.76–1.27)
Globulin, Total: 1.9 g/dL (ref 1.5–4.5)
Glucose: 108 mg/dL — ABNORMAL HIGH (ref 65–99)
Potassium: 3.8 mmol/L (ref 3.5–5.2)
Sodium: 138 mmol/L (ref 134–144)
Total Protein: 6.1 g/dL (ref 6.0–8.5)
eGFR: 88 mL/min/{1.73_m2} (ref >59)

## 2020-12-01 LAB — LIPID PANEL
Chol/HDL Ratio: 3 ratio (ref 0.0–5.0)
Cholesterol, Total: 162 mg/dL (ref 100–199)
HDL: 54 mg/dL (ref >39)
LDL Chol Calc (NIH): 87 mg/dL (ref 0–99)
Triglycerides: 121 mg/dL (ref 0–149)
VLDL Cholesterol Cal: 21 mg/dL (ref 5–40)

## 2020-12-01 LAB — TSH: TSH: 2.62 u[IU]/mL (ref 0.450–4.500)

## 2021-01-15 ENCOUNTER — Other Ambulatory Visit: Payer: Self-pay | Admitting: Family Medicine

## 2021-01-15 DIAGNOSIS — M8949 Other hypertrophic osteoarthropathy, multiple sites: Secondary | ICD-10-CM

## 2021-01-15 DIAGNOSIS — K219 Gastro-esophageal reflux disease without esophagitis: Secondary | ICD-10-CM

## 2021-01-15 DIAGNOSIS — M159 Polyosteoarthritis, unspecified: Secondary | ICD-10-CM

## 2021-01-16 ENCOUNTER — Other Ambulatory Visit: Payer: Self-pay | Admitting: Family Medicine

## 2021-01-23 ENCOUNTER — Other Ambulatory Visit: Payer: Self-pay | Admitting: Family Medicine

## 2021-01-23 DIAGNOSIS — M159 Polyosteoarthritis, unspecified: Secondary | ICD-10-CM

## 2021-01-25 ENCOUNTER — Telehealth: Payer: Self-pay | Admitting: Family Medicine

## 2021-01-25 NOTE — Telephone Encounter (Signed)
Pt called to let Dr Lajuana Ripple know that the Celebrex that she switched him to is not working for him and he wants to go back to taking the Meloxicam that he was on.  Needs refills called in to [facility].  Call pt to let him know if/when refills are sent in [phone]

## 2021-01-28 MED ORDER — MELOXICAM 15 MG PO TABS: 15.0000 mg | ORAL_TABLET | Freq: Every day | ORAL | 1 refills | Status: DC

## 2021-01-28 NOTE — Telephone Encounter (Signed)
Prescription sent to pharmacy.

## 2021-03-06 ENCOUNTER — Other Ambulatory Visit: Payer: Self-pay

## 2021-03-06 ENCOUNTER — Encounter: Payer: Self-pay | Admitting: Family Medicine

## 2021-03-06 ENCOUNTER — Ambulatory Visit: Payer: Managed Care, Other (non HMO) | Admitting: Family Medicine

## 2021-03-06 VITALS — BP 137/86 | HR 104 | Temp 97.6°F | Ht 68.0 in | Wt 199.2 lb

## 2021-03-06 DIAGNOSIS — F1020 Alcohol dependence, uncomplicated: Secondary | ICD-10-CM

## 2021-03-06 DIAGNOSIS — F32A Depression, unspecified: Secondary | ICD-10-CM | POA: Diagnosis not present

## 2021-03-06 DIAGNOSIS — Z0001 Encounter for general adult medical examination with abnormal findings: Secondary | ICD-10-CM | POA: Diagnosis not present

## 2021-03-06 DIAGNOSIS — Z23 Encounter for immunization: Secondary | ICD-10-CM | POA: Diagnosis not present

## 2021-03-06 DIAGNOSIS — M19049 Primary osteoarthritis, unspecified hand: Secondary | ICD-10-CM | POA: Diagnosis not present

## 2021-03-06 DIAGNOSIS — Z0000 Encounter for general adult medical examination without abnormal findings: Secondary | ICD-10-CM

## 2021-03-06 MED ORDER — MELOXICAM 15 MG PO TABS: 15.0000 mg | ORAL_TABLET | Freq: Every day | ORAL | 3 refills | Status: DC

## 2021-03-06 NOTE — Patient Instructions (Signed)
Alcohol Abuse and Dependence Information, Adult Alcohol is a widely available drug. People drink alcohol in different amounts. People who drink alcohol very often and in large amounts often have problems during and after drinking. They may develop what is called an alcohol use disorder. There are two main types of alcohol use disorders: Alcohol abuse. This is when you use alcohol too much or too often. You may use alcohol to make yourself feel happy or to reduce stress. You may have a hard time setting a limit on the amount you drink. Alcohol dependence. This is when you use alcohol consistently for a period of time, and your body changes as a result. This can make it hard to stop drinking because you may start to feel sick or feel different when you do not use alcohol. These symptoms are known as withdrawal. How can alcohol abuse and dependence affect me? Alcohol abuse and dependence can have a negative effect on your life. Drinking too much can lead to addiction. You may feel like you need alcohol to function normally. You may drink alcohol before work in the morning, during the day, or as soon as you get home from work in the evening. These actions can result in: Poor work performance. Job loss. Financial problems. Car crashes or criminal charges from driving after drinking alcohol. Problems in your relationships with friends and family. Losing the trust and respect of coworkers, friends, and family. Drinking heavily over a long period of time can permanently damage your body and brain, and can cause lifelong health issues, such as: Damage to your liver or pancreas. Heart problems, high blood pressure, or stroke. Certain cancers. Decreased ability to fight infections. Brain or nerve damage. Depression. Early (premature) death. If you are careless or you crave alcohol, it is easy to drink more than your body can handle (overdose). Alcohol overdose is a serious situation that requires  hospitalization. It may lead to permanent injuries or death. What can increase my risk? Having a family history of alcohol abuse. Having depression or other mental health conditions. Beginning to drink at an early age. Binge drinking often. Experiencing trauma, stress, and an unstable home life during childhood. Spending time with people who drink often. What actions can I take to prevent or manage alcohol abuse and dependence? Do not drink alcohol if: Your health care provider tells you not to drink. You are pregnant, may be pregnant, or are planning to become pregnant. If you drink alcohol: Limit how much you use to: 0-1 drink a day for women. 0-2 drinks a day for men. Be aware of how much alcohol is in your drink. In the [place] one drink equals one 12 oz bottle of beer (355 mL), one 5 oz glass of wine (148 mL), or one 1 oz glass of hard liquor (44 mL). Stop drinking if you have been drinking too much. This can be very hard to do if you are used to abusing alcohol. If you begin to have withdrawal symptoms, talk with your health care provider or a person that you trust. These symptoms may include anxiety, shaky hands, headache, nausea, sweating, or not being able to sleep. Choose to drink nonalcoholic beverages in social gatherings and places where there may be alcohol. Activity Spend more time on activities that you enjoy that do not involve alcohol, like hobbies or exercise. Find healthy ways to cope with stress, such as exercise, meditation, or spending time with people you care about. General information Talk to your family,   coworkers, and friends about supporting you in your efforts to stop drinking. If they drink, ask them not to drink around you. Spend more time with people who do not drink alcohol. If you think that you have an alcohol dependency problem: Tell friends or family about your concerns. Talk with your health care provider or another health professional about where to  get help. Work with a therapist and a chemical dependency counselor. Consider joining a support group for people who struggle with alcohol abuse and dependence. Where to find support  Your health care provider. SMART Recovery: www.smartrecovery.org Therapy and support groups Local treatment centers or chemical dependency counselors. Local AA groups in your community: www.aa.org Where to find more information Centers for Disease Control and Prevention: www.cdc.gov National Institute on Alcohol Abuse and Alcoholism: www.niaaa.nih.gov Alcoholics Anonymous (AA): www.aa.org Contact a health care provider if: You drank more or for longer than you intended on more than one occasion. You tried to stop drinking or to cut back on how much you drink, but you were not able to. You often drink to the point of vomiting or passing out. You want to drink so badly that you cannot think about anything else. You have problems in your life due to drinking, but you continue to drink. You keep drinking even though you feel anxious, depressed, or have experienced memory loss. You have stopped doing the things you used to enjoy in order to drink. You have to drink more than you used to in order to get the effect you want. You experience anxiety, sweating, nausea, shakiness, and trouble sleeping when you try to stop drinking. Get help right away if: You have thoughts about hurting yourself or others. You have serious withdrawal symptoms, including: Confusion. Racing heart. High blood pressure. Fever. If you ever feel like you may hurt yourself or others, or have thoughts about taking your own life, get help right away. You can go to your nearest emergency department or call: Your local emergency services (911 in the [place] A suicide crisis helpline, such as the National Suicide Prevention Lifeline at 1-800-273-8255 or 988 in the [place] This is open 24 hours a day. Summary Alcohol abuse and dependence can  have a negative effect on your life. Drinking too much or too often can lead to addiction. If you drink alcohol, limit how much you use. If you are having trouble keeping your drinking under control, find ways to change your behavior. Hobbies, calming activities, exercise, or support groups can help. If you feel you need help with changing your drinking habits, talk with your health care provider, a good friend, or a therapist, or go to an AA group. This information is not intended to replace advice given to you by your health care provider. Make sure you discuss any questions you have with your health care provider. Document Revised: 10/31/2020 Document Reviewed: 06/15/2018 Elsevier Patient Education  2022 Elsevier Inc.  

## 2021-03-06 NOTE — Progress Notes (Signed)
Alec Gutierrez is a 60 y.o. male presents to office today for annual physical exam examination.    Concerns today include: 1. Arthritis Celebrex is not helpful.  He would like to get back to meloxicam  Occupation: Alec Gutierrez, Marital status: Single, Substance use: 8-10 beers per night.  Former smoker Diet: Low in fruit, Exercise: No structured Last eye exam: Greater than 2 years ago Last colonoscopy: 06/2019 Refills needed today: Meloxicam Immunizations needed: Immunization History  Administered Date(s) Administered   Influenza Whole 02/12/2012   Influenza,inj,Quad PF,6+ Mos 04/20/2013, 01/30/2015, 03/10/2016, 01/15/2017, 01/18/2019, 02/03/2021   Influenza-Unspecified 04/04/2014, 01/31/2018   Moderna SARS-COV2 Booster Vaccination 03/30/2020   Moderna Sars-Covid-2 Vaccination 08/29/2019, 09/27/2019   Tdap 12/14/2006, 01/31/2018   Zoster Recombinat (Shingrix) 05/20/2018   Zoster, Live 02/20/2015     Past Medical History:  Diagnosis Date   Arthritis    GERD (gastroesophageal reflux disease)    History of BPH    Hyperlipidemia    Hypertension    Sleep apnea    uses CPAP    Social History   Socioeconomic History   Marital status: Divorced    Spouse name: Not on file   Number of children: Not on file   Years of education: Not on file   Highest education level: Not on file  Occupational History   Not on file  Tobacco Use   Smoking status: Former    Packs/day: 0.25    Types: Cigarettes    Quit date: 05/15/2015    Years since quitting: 5.8   Smokeless tobacco: Never  Vaping Use   Vaping Use: Never used  Substance and Sexual Activity   Alcohol use: Yes    Alcohol/week: 49.0 standard drinks    Types: 49 Cans of beer per week   Drug use: No   Sexual activity: Not on file  Other Topics Concern   Not on file  Social History Narrative   Not on file   Social Determinants of Health   Financial Resource Strain: Not on file  Food Insecurity: Not on file   Transportation Needs: Not on file  Physical Activity: Not on file  Stress: Not on file  Social Connections: Not on file  Intimate Partner Violence: Not on file   Past Surgical History:  Procedure Laterality Date   COLONOSCOPY  02/09/2008   EYE SURGERY     HERNIA REPAIR     inguinal and abd   Family History  Problem Relation Age of Onset   Cancer Mother 76       unsure pt thinks it was colon cancer   Colon cancer Mother    Heart disease Father    Stroke Father    Esophageal cancer Neg Hx    Rectal cancer Neg Hx    Stomach cancer Neg Hx    Colon polyps Neg Hx     Current Outpatient Medications:    amLODipine (NORVASC) 5 MG tablet, TAKE ONE TABLET BY MOUTH DAILY, Disp: 90 tablet, Rfl: 0   atorvastatin (LIPITOR) 40 MG tablet, TAKE ONE TABLET BY MOUTH DAILY, Disp: 90 tablet, Rfl: 1   buPROPion (WELLBUTRIN XL) 150 MG 24 hr tablet, Take 3 tablets (450 mg total) by mouth daily., Disp: 270 tablet, Rfl: 3   hydrochlorothiazide (HYDRODIURIL) 25 MG tablet, TAKE ONE TABLET BY MOUTH DAILY, Disp: 90 tablet, Rfl: 0   omeprazole (PRILOSEC) 40 MG capsule, TAKE ONE CAPSULE BY MOUTH DAILY, Disp: 90 capsule, Rfl: 0   meloxicam (MOBIC) 15 MG  tablet, Take 1 tablet (15 mg total) by mouth daily., Disp: 90 tablet, Rfl: 3  No Known Allergies   ROS: Review of Systems A comprehensive review of systems was negative except for: Eyes: positive for contacts/glasses Behavioral/Psych: positive for depression and excessive alcohol consumption    Physical exam BP 137/86   Pulse (!) 104   Temp 97.6 F (36.4 C)   Ht 5\' 8"  (1.727 m)   Wt 199 lb 3.2 oz (90.4 kg)   SpO2 98%   BMI 30.29 kg/m  General appearance: alert, cooperative, appears stated age, and no distress Head: Normocephalic, without obvious abnormality, atraumatic Eyes: negative findings: lids and lashes normal, conjunctivae and sclerae normal, corneas clear, and pupils equal, round, reactive to light and accomodation Ears: normal TM's and  external ear canals both ears Nose: Nares normal. Septum midline. Mucosa normal. No drainage or sinus tenderness. Throat: lips, mucosa, and tongue normal; teeth and gums normal Neck: no adenopathy, supple, symmetrical, trachea midline, and thyroid not enlarged, symmetric, no tenderness/mass/nodules Back: symmetric, no curvature. ROM normal. No CVA tenderness. Lungs: clear to auscultation bilaterally Chest wall: no tenderness Heart: regular rate and rhythm, S1, S2 normal, no murmur, click, rub or gallop Abdomen:  Protuberant but no appreciable hepatosplenomegaly.  Nontender Extremities: extremities normal, atraumatic, no cyanosis or edema Pulses: 2+ and symmetric Skin: Skin color, texture, turgor normal. No rashes or lesions Lymph nodes: Cervical, supraclavicular, and axillary nodes normal. Neurologic: Grossly normal Psych: Affect flat.  Patient is pleasant, interactive.  Depression screen [facility] 2/9 03/06/2021 11/30/2020 12/02/2019  Decreased Interest 3 3 0  Down, Depressed, Hopeless 1 2 0  PHQ - 2 Score 4 5 0  Altered sleeping 0 1 0  Tired, decreased energy 3 3 1   Change in appetite 0 0 0  Feeling bad or failure about yourself  1 2 0  Trouble concentrating 0 0 0  Moving slowly or fidgety/restless 0 2 0  Suicidal thoughts 0 0 0  PHQ-9 Score 8 13 1   Difficult doing work/chores Somewhat difficult Somewhat difficult -  Some recent data might be hidden   GAD 7 : Generalized Anxiety Score 03/06/2021 11/30/2020 05/03/2018 05/07/2015  Nervous, Anxious, on Edge 3 0 2 3  Control/stop worrying 0 0 0 0  Worry too much - different things 0 0 1 0  Trouble relaxing 0 0 0 0  Restless 0 0 0 0  Easily annoyed or irritable 3 2 2 2   Afraid - awful might happen 0 0 0 0  Total GAD 7 Score 6 2 5 5   Anxiety Difficulty - Somewhat difficult Somewhat difficult -    Assessment/ Plan: Alec Gutierrez here for annual physical exam.   Annual physical exam  Depressed mood with feeling of  loneliness  Alcoholism ([place])  Hand arthritis - Plan: meloxicam (MOBIC) 15 MG tablet  Need for shingles vaccine - Plan: Varicella-zoster vaccine IM (Shingrix)  Shingles vaccine administered  Depression essentially stable.  I offered counseling services but he declined this today.  Alcoholism likely impacting the above.  He seems to be in the contemplative versus action phase of weaning from alcohol.  We discussed gradual weaning from this so as to avoid withdrawal.  He voiced good understanding.  I offered community resources but he is reluctant to engage with these.  Handout was provided  Arthritis is stable.  Meloxicam renewed.  Celebrex was not helpful  Counseled on healthy lifestyle choices, including diet (rich in fruits, vegetables and lean meats and low  in salt and simple carbohydrates) and exercise (at least 30 minutes of moderate physical activity daily).  Recheck in 4 to 6 months  Alec Gutierrez M. Lajuana Ripple, DO

## 2021-04-12 ENCOUNTER — Other Ambulatory Visit: Payer: Self-pay | Admitting: Family Medicine

## 2021-04-12 DIAGNOSIS — K219 Gastro-esophageal reflux disease without esophagitis: Secondary | ICD-10-CM

## 2021-05-07 ENCOUNTER — Telehealth: Payer: Self-pay | Admitting: Family Medicine

## 2021-05-07 DIAGNOSIS — K219 Gastro-esophageal reflux disease without esophagitis: Secondary | ICD-10-CM

## 2021-05-07 DIAGNOSIS — M19049 Primary osteoarthritis, unspecified hand: Secondary | ICD-10-CM

## 2021-05-07 MED ORDER — OMEPRAZOLE 40 MG PO CPDR: 40.0000 mg | DELAYED_RELEASE_CAPSULE | Freq: Every day | ORAL | 1 refills | Status: DC

## 2021-05-07 MED ORDER — MELOXICAM 15 MG PO TABS: 15.0000 mg | ORAL_TABLET | Freq: Every day | ORAL | 2 refills | Status: DC

## 2021-05-07 MED ORDER — AMLODIPINE BESYLATE 5 MG PO TABS: 5.0000 mg | ORAL_TABLET | Freq: Every day | ORAL | 1 refills | Status: DC

## 2021-05-07 MED ORDER — ATORVASTATIN CALCIUM 40 MG PO TABS: 40.0000 mg | ORAL_TABLET | Freq: Every day | ORAL | 1 refills | Status: DC

## 2021-05-07 MED ORDER — HYDROCHLOROTHIAZIDE 25 MG PO TABS: 25.0000 mg | ORAL_TABLET | Freq: Every day | ORAL | 1 refills | Status: DC

## 2021-05-07 NOTE — Telephone Encounter (Signed)
Pt aware refills sent to [facility] [place], I had misunderstood below message & called [facility] to cancel orders I sent there, pt said he was also confused but letter to him said maintenance meds to go to [facility].

## 2021-05-07 NOTE — Telephone Encounter (Signed)
°  Prescription Request  05/07/2021  Is this a "Controlled Substance" medicine? no  Have you seen your PCP in the last 2 weeks? 03/06/2021  If YES, route message to pool  -  If NO, patient needs to be scheduled for appointment.  What is the name of the medication or equipment? HYDROCHOLORTHIA, AMLODIPINE, MELOXICAM, ATORVASTIN, AND OMEPRAZOLE  Have you contacted your pharmacy to request a refill? no  Which pharmacy would you like this sent to? Pt said his insurance changed to [facility] but said he still uses [facility]   Patient notified that their request is being sent to the clinical staff for review and that they should receive a response within 2 business days.

## 2021-05-31 ENCOUNTER — Telehealth: Payer: Self-pay | Admitting: Family Medicine

## 2021-05-31 NOTE — Telephone Encounter (Signed)
Pharmacy changed in chart 

## 2021-08-04 ENCOUNTER — Other Ambulatory Visit: Payer: Self-pay | Admitting: Family Medicine

## 2021-09-03 ENCOUNTER — Ambulatory Visit: Payer: Managed Care, Other (non HMO) | Admitting: Family Medicine

## 2021-09-03 ENCOUNTER — Encounter: Payer: Self-pay | Admitting: Family Medicine

## 2021-09-03 ENCOUNTER — Ambulatory Visit (INDEPENDENT_AMBULATORY_CARE_PROVIDER_SITE_OTHER): Payer: Managed Care, Other (non HMO)

## 2021-09-03 VITALS — BP 134/80 | HR 96 | Temp 98.2°F | Ht 68.0 in | Wt 202.2 lb

## 2021-09-03 DIAGNOSIS — Z125 Encounter for screening for malignant neoplasm of prostate: Secondary | ICD-10-CM

## 2021-09-03 DIAGNOSIS — R0602 Shortness of breath: Secondary | ICD-10-CM

## 2021-09-03 DIAGNOSIS — Z87891 Personal history of nicotine dependence: Secondary | ICD-10-CM

## 2021-09-03 DIAGNOSIS — R5382 Chronic fatigue, unspecified: Secondary | ICD-10-CM

## 2021-09-03 DIAGNOSIS — M24542 Contracture, left hand: Secondary | ICD-10-CM | POA: Diagnosis not present

## 2021-09-03 DIAGNOSIS — M24541 Contracture, right hand: Secondary | ICD-10-CM | POA: Diagnosis not present

## 2021-09-03 DIAGNOSIS — F1090 Alcohol use, unspecified, uncomplicated: Secondary | ICD-10-CM

## 2021-09-03 NOTE — Progress Notes (Addendum)
Subjective: CC: Depression PCP: Janora Norlander, DO Alec Gutierrez is a 61 y.o. male presenting to clinic today for:  1.  Depression Patient reports good control of depression with Wellbutrin.  2.  Fatigue Patient reports chronic fatigue.  He works third shift at [employer] but has felt more tired lately.  Does not report any blood loss.  No change in voice.  He had an eye surgery done on the left to clean up some scarring after cataract removal.  Otherwise really no change.  3.  Shortness of breath Reports some shortness of breath where he feels like he cannot take a deep breath in.  Does not report increased work of breathing otherwise.  This can happen both at rest and with activity.  Denies any lower extremity edema, orthopnea.  4.  Hand contracture Patient reports he started to get some weakness in his hands bilaterally.  He has had contractures of digits 4 and 5 "his whole life".  Does not report any sensory changes.   ROS: Per HPI  No Known Allergies Past Medical History:  Diagnosis Date   Arthritis    GERD (gastroesophageal reflux disease)    History of BPH    Hyperlipidemia    Hypertension    Sleep apnea    uses CPAP     Current Outpatient Medications:    amLODipine (NORVASC) 5 MG tablet, Take 1 tablet (5 mg total) by mouth daily., Disp: 90 tablet, Rfl: 1   atorvastatin (LIPITOR) 40 MG tablet, Take 1 tablet (40 mg total) by mouth daily., Disp: 90 tablet, Rfl: 1   buPROPion (WELLBUTRIN XL) 150 MG 24 hr tablet, TAKE 3 TABLETS BY MOUTH DAILY, Disp: 90 tablet, Rfl: 0   hydrochlorothiazide (HYDRODIURIL) 25 MG tablet, Take 1 tablet (25 mg total) by mouth daily., Disp: 90 tablet, Rfl: 1   meloxicam (MOBIC) 15 MG tablet, Take 1 tablet (15 mg total) by mouth daily., Disp: 90 tablet, Rfl: 2   omeprazole (PRILOSEC) 40 MG capsule, Take 1 capsule (40 mg total) by mouth daily., Disp: 90 capsule, Rfl: 1   prednisoLONE acetate (PRED FORTE) 1 % ophthalmic suspension,  SMARTSIG:In Eye(s), Disp: , Rfl:  Social History   Socioeconomic History   Marital status: Divorced    Spouse name: Not on file   Number of children: Not on file   Years of education: Not on file   Highest education level: Not on file  Occupational History   Not on file  Tobacco Use   Smoking status: Former    Packs/day: 0.25    Types: Cigarettes    Quit date: 05/15/2015    Years since quitting: 6.3   Smokeless tobacco: Never  Vaping Use   Vaping Use: Never used  Substance and Sexual Activity   Alcohol use: Yes    Alcohol/week: 49.0 standard drinks    Types: 49 Cans of beer per week   Drug use: No   Sexual activity: Not on file  Other Topics Concern   Not on file  Social History Narrative   Not on file   Social Determinants of Health   Financial Resource Strain: Not on file  Food Insecurity: Not on file  Transportation Needs: Not on file  Physical Activity: Not on file  Stress: Not on file  Social Connections: Not on file  Intimate Partner Violence: Not on file   Family History  Problem Relation Age of Onset   Cancer Mother 46  unsure pt thinks it was colon cancer   Colon cancer Mother    Heart disease Father    Stroke Father    Esophageal cancer Neg Hx    Rectal cancer Neg Hx    Stomach cancer Neg Hx    Colon polyps Neg Hx     Objective: Office vital signs reviewed. BP 134/80   Pulse 96   Temp 98.2 F (36.8 C)   Ht _0  (1.727 m)   Wt 202 lb 3.2 oz (91.7 kg)   SpO2 96%   BMI 30.74 kg/m   Physical Examination:  General: Awake, alert, obese, No acute distress HEENT: No exophthalmos.  No goiter. Cardio: regular rate and rhythm, S1S2 heard, no murmurs appreciated Pulm: clear to auscultation bilaterally, no wheezes, rhonchi or rales; normal work of breathing on room air GI: Protuberant MSK: Contractures noted in digits 4 and 5 bilaterally.  I am unable to passively straighten these fingers Skin: dry; intact; no rashes or lesions Neuro: No  tremor Psych: Mood stable, speech normal.  Affect flat.  Very pleasant and interactive  Assessment/ Plan: 61 y.o. 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  Has a 35+ pack year history.  I offered low-dose CAT scan for lung cancer screening but he declined this.  Wishes to proceed with x-ray only at this time.  Understands risks of possible undiagnosed lesions  Patient considered seeing the orthopedist for the contracture of bilateral hands but due to his work schedule he declined any evaluation at this time.  Unfortunately having some hand weakness which causes some concern.  For chronic fatigue, uncertain etiology but may be multifactorial including body habitus, excessive alcohol use, night shift worker etc.  We will look for metabolic etiology  Screening for prostate cancer collected along with remainder of labs  Orders Placed This Encounter  Procedures   DG Chest 2 View    Standing Status:   Future    Number of Occurrences:   1    Standing Expiration Date:   09/04/2022    Order Specific Question:   Reason for Exam (SYMPTOM  OR DIAGNOSIS REQUIRED)    Answer:   shortness of breath, h/o smoker that quit 10 years ago    Order Specific Question:   Preferred imaging location?    Answer:   [facility]   CT Chest Wo Contrast    Please try to arrange early AM as pt works 3rd shift.    Standing Status:   Future    Standing Expiration Date:   09/21/2022    Order Specific Question:   Preferred imaging location?     Answer:   [facility]   CBC   TSH   T4, Free   Vitamin B12   Testosterone   CMP14+EGFR   PSA   Folate   No orders of the defined types were placed in this encounter.  ** patient called back and does want to proceed with CT.  CT chest ordered.  Janora Norlander, DO [facility] [phone]

## 2021-09-03 NOTE — Patient Instructions (Signed)
If you decide you want to see the hand specialist, let me know. ? ?Checking a few labs to look at why you are so tired. ? ?Will call with lab and xray results once available. I recommended a CT scan today but you declined.  Let me know if you change your mind. ? ?Lung Cancer Screening ?A lung cancer screening is a test that checks for lung cancer when there are no symptoms or history of that disease. The screening is done to look for lung cancer in its very early stages. Finding cancer early improves the chances of successful treatment. It may save your life. ?Who should have a screening? ?You should be screened for lung cancer if all of these apply: ?You currently smoke, or you have quit smoking within the past 15 years. ?You are between the ages of 5 and 74 years old. Screening may be recommended up to age 54 depending on your overall health and other factors. ?You have a smoking history of 1 pack of cigarettes a day for 20 years or 2 packs a day for 10 years. ?How is screening done? ? ?The recommended screening test is a low-dose computed tomography (LDCT) scan. This scan takes detailed images of the lungs. This allows a health care provider to look for abnormal cells. If you are at risk for lung cancer, it is recommended that you get screened once a year. Talk to your health care provider about the risks, benefits, and limitations of screening. ?What are the benefits of screening? ?Screening can find lung cancer early, before symptoms start and before it has spread outside of the lungs. The chances of curing lung cancer are greater if the cancer is diagnosed early. ?What are the risks of screening? ?The screening may show lung cancer when no cancer is present. Talk with your health care provider about what your results mean. In some cases, your health care provider may do more testing to confirm the results. ?The screening may not find lung cancer when it is present. ?You will be exposed to radiation from  repeated LDCT tests, which can cause cancer in otherwise healthy people. ?How can I lower my risk of lung cancer? ?Make these lifestyle changes to lower your risk of developing lung cancer: ?Do not use any products that contain nicotine or tobacco. These products include cigarettes, chewing tobacco, and vaping devices, such as e-cigarettes. If you need help quitting, ask your health care provider. ?Avoid secondhand smoke. ?Avoid exposure to radiation. ?Avoid exposure to radon gas. Have your home checked for radon regularly. ?Avoid things that cause cancer (carcinogens). ?Avoid living or working in places with high air pollution or diesel exhaust. ?Questions to ask your health care provider ?Am I eligible for lung cancer screening? ?Does my health insurance cover the cost of lung cancer screening? ?What happens if the lung cancer screening shows something of concern? ?How soon will I have results from my lung cancer screening? ?Is there anything that I need to do to prepare for my lung cancer screening? ?What happens if I decide not to have lung cancer screening? ?Where to find more information ?Ask your health care provider about the risks and benefits of screening. More information and resources are available from these organizations: ?[place] (ACS): www.cancer.org ?American Lung Association: www.lung.org ?[place]: www.cancer.gov ?Contact a health care provider if: ?You start to show symptoms of lung cancer, including: ?A cough that will not go away. ?High-pitched whistling sounds when you breathe, most often when  you breathe out (wheezing). ?Chest pain. ?Coughing up blood. ?Shortness of breath. ?Weight loss that cannot be explained. ?Constant tiredness (fatigue). ?Hoarse voice. ?Summary ?Lung cancer screening may find lung cancer before symptoms appear. Finding cancer early improves the chances of successful treatment. It may save your life. ?The recommended screening test is a  low-dose computed tomography (LDCT) scan that looks for abnormal cells in the lungs. If you are at risk for lung cancer, it is recommended that you get screened once a year. ?You can make lifestyle changes to lower your risk of lung cancer. ?Ask your health care provider about the risks and benefits of screening. ?This information is not intended to replace advice given to you by your health care provider. Make sure you discuss any questions you have with your health care provider. ?Document Revised: 09/26/2020 Document Reviewed: 09/26/2020 ?Elsevier Patient Education ? [place]. ? ?

## 2021-09-04 LAB — CMP14+EGFR
ALT: 22 IU/L (ref 0–44)
AST: 19 IU/L (ref 0–40)
Albumin/Globulin Ratio: 2.1 (ref 1.2–2.2)
Albumin: 4.4 g/dL (ref 3.8–4.8)
Alkaline Phosphatase: 65 IU/L (ref 44–121)
BUN/Creatinine Ratio: 16 (ref 10–24)
BUN: 16 mg/dL (ref 8–27)
Bilirubin Total: 0.4 mg/dL (ref 0.0–1.2)
CO2: 21 mmol/L (ref 20–29)
Calcium: 9.1 mg/dL (ref 8.6–10.2)
Chloride: 100 mmol/L (ref 96–106)
Creatinine, Ser: 1.03 mg/dL (ref 0.76–1.27)
Globulin, Total: 2.1 g/dL (ref 1.5–4.5)
Glucose: 104 mg/dL — ABNORMAL HIGH (ref 70–99)
Potassium: 4 mmol/L (ref 3.5–5.2)
Sodium: 137 mmol/L (ref 134–144)
Total Protein: 6.5 g/dL (ref 6.0–8.5)
eGFR: 83 mL/min/{1.73_m2} (ref >59)

## 2021-09-04 LAB — TSH: TSH: 2.5 u[IU]/mL (ref 0.450–4.500)

## 2021-09-04 LAB — T4, FREE: Free T4: 1.33 ng/dL (ref 0.82–1.77)

## 2021-09-04 LAB — CBC
Hematocrit: 40.3 % (ref 37.5–51.0)
Hemoglobin: 13.8 g/dL (ref 13.0–17.7)
MCH: 29.3 pg (ref 26.6–33.0)
MCHC: 34.2 g/dL (ref 31.5–35.7)
MCV: 86 fL (ref 79–97)
Platelets: 284 10*3/uL (ref 150–450)
RBC: 4.71 x10E6/uL (ref 4.14–5.80)
RDW: 12.6 % (ref 11.6–15.4)
WBC: 9.6 10*3/uL (ref 3.4–10.8)

## 2021-09-04 LAB — TESTOSTERONE: Testosterone: 368 ng/dL (ref 264–916)

## 2021-09-04 LAB — PSA: Prostate Specific Ag, Serum: 1 ng/mL (ref 0.0–4.0)

## 2021-09-04 LAB — FOLATE: Folate: 9.4 ng/mL (ref >3.0)

## 2021-09-04 LAB — VITAMIN B12: Vitamin B-12: 303 pg/mL (ref 232–1245)

## 2021-09-20 ENCOUNTER — Telehealth: Payer: Self-pay | Admitting: Family Medicine

## 2021-09-20 NOTE — Addendum Note (Signed)
Addended by: Janora Norlander on: 09/20/2021 12:27 PM   Modules accepted: Orders

## 2021-09-20 NOTE — Telephone Encounter (Signed)
Pt called to let PCP know that he has changed his mind and does want to proceed with doing the CAT Scan that they talked about a few weeks ago.   Wants appt to be scheduled as early in the morning as possible because pt works 3rd shift.

## 2021-09-20 NOTE — Telephone Encounter (Signed)
Pt returned missed call. Made pt aware that CAT Scan was ordered and that someone will call him with more info about it when info is available.

## 2021-09-20 NOTE — Telephone Encounter (Signed)
Attempted to contact patient - NA °

## 2021-09-20 NOTE — Telephone Encounter (Signed)
ordered

## 2021-09-25 ENCOUNTER — Other Ambulatory Visit: Payer: Self-pay | Admitting: Family Medicine

## 2021-09-27 ENCOUNTER — Telehealth: Payer: Self-pay | Admitting: Family Medicine

## 2021-10-23 ENCOUNTER — Ambulatory Visit
Admission: RE | Admit: 2021-10-23 | Discharge: 2021-10-23 | Disposition: A | Discharge status: Home / Self Care | Payer: Managed Care, Other (non HMO) | Source: Ambulatory Visit | Attending: Family Medicine | Admitting: Family Medicine

## 2021-10-23 DIAGNOSIS — R0602 Shortness of breath: Secondary | ICD-10-CM

## 2021-10-23 DIAGNOSIS — Z87891 Personal history of nicotine dependence: Secondary | ICD-10-CM

## 2021-10-23 DIAGNOSIS — R5382 Chronic fatigue, unspecified: Secondary | ICD-10-CM

## 2021-10-25 ENCOUNTER — Telehealth: Payer: Self-pay | Admitting: Family Medicine

## 2021-10-25 NOTE — Telephone Encounter (Signed)
I spoke to pt and advised pt that Dr Darnell Level is out of the office but will return Monday and will review CT but reassured pt that the CT did show some nodules but would only possibly need serial imaging in a year to monitor for any changes and that Dr Darnell Level would review the CT and make any further recommendations and we would call him back.   Pt then proceeded to inform me that he has noticed he does have BLE edema after being at work all shift which improves once he elevates his legs. He denies increased SOB from baseline, denies pain in legs or redness, sores, weeping of fluid. I offered pt appt to have evaluated Monday but pt declined and wanted to wait to see Dr Darnell Level so I put him on her DOD schedule 11/01/21 at 8:05 and advised pt if he had any change in his symptoms to call us back to reassess and pt voiced understanding.

## 2021-10-25 NOTE — Telephone Encounter (Signed)
Please call pt with CT results ASAP.

## 2021-10-28 NOTE — Telephone Encounter (Signed)
Patient aware and verbalizes understanding. States will talk to Dr. Darnell Level about it when he has his apt this week.

## 2021-10-28 NOTE — Telephone Encounter (Signed)
Sent results to pools but the CT was unrevealing.  Recommend eval with pulmonology to determine the etiology of symptoms.

## 2021-10-28 NOTE — Progress Notes (Signed)
Returning nurse call.

## 2021-11-01 ENCOUNTER — Encounter: Payer: Self-pay | Admitting: Family Medicine

## 2021-11-01 ENCOUNTER — Ambulatory Visit: Payer: Managed Care, Other (non HMO) | Admitting: Family Medicine

## 2021-11-01 VITALS — BP 125/78 | HR 96 | Temp 98.1°F | Ht 68.0 in | Wt 202.2 lb

## 2021-11-01 DIAGNOSIS — M24542 Contracture, left hand: Secondary | ICD-10-CM | POA: Diagnosis not present

## 2021-11-01 DIAGNOSIS — R609 Edema, unspecified: Secondary | ICD-10-CM | POA: Diagnosis not present

## 2021-11-01 DIAGNOSIS — M24541 Contracture, right hand: Secondary | ICD-10-CM | POA: Diagnosis not present

## 2021-11-01 NOTE — Progress Notes (Signed)
Subjective: CC: Edema PCP: Janora Norlander, DO Alec Gutierrez is a 61 y.o. male presenting to clinic today for:  1.  Edema Patient reports that he has some swelling in bilateral legs at the end of his work shifts.  He will notice this after he takes off his boots.  He does not report any weeping of the legs but he was worried about possible blood clot.  He denies any pain, swelling, heat or erythema.  The swelling always goes down each morning.  He is on HCTZ.  He does admit to quite a bit of salt consumption, citing that he eats fat back and pretty much every vegetable that he makes.  He eats a lot of prepared foods as well.  His "shortness of breath", which he now describes as not being able to take a deep breath then, is unchanged.  He is getting around without difficulty.  He attributes this to his big belly.  2.  Contractures Patient does not want to see a hand specialist about the contractures that he is having in bilateral hands.  He is right-hand dominant.  He feels like the contractures in digits 4 and 5 bilaterally have been present since as long as he can remember.  He denies any sensory changes but does like his hands "drawing up".  Grip is decreased.  Not dropping any items.   ROS: Per HPI  No Known Allergies Past Medical History:  Diagnosis Date   Arthritis    GERD (gastroesophageal reflux disease)    History of BPH    Hyperlipidemia    Hypertension    Sleep apnea    uses CPAP     Current Outpatient Medications:    amLODipine (NORVASC) 5 MG tablet, Take 1 tablet (5 mg total) by mouth daily., Disp: 90 tablet, Rfl: 1   atorvastatin (LIPITOR) 40 MG tablet, Take 1 tablet (40 mg total) by mouth daily., Disp: 90 tablet, Rfl: 1   buPROPion (WELLBUTRIN XL) 150 MG 24 hr tablet, TAKE THREE TABLETS BY MOUTH DAILY, Disp: 90 tablet, Rfl: 0   hydrochlorothiazide (HYDRODIURIL) 25 MG tablet, Take 1 tablet (25 mg total) by mouth daily., Disp: 90 tablet, Rfl: 1   meloxicam  (MOBIC) 15 MG tablet, Take 1 tablet (15 mg total) by mouth daily., Disp: 90 tablet, Rfl: 2   omeprazole (PRILOSEC) 40 MG capsule, Take 1 capsule (40 mg total) by mouth daily., Disp: 90 capsule, Rfl: 1   prednisoLONE acetate (PRED FORTE) 1 % ophthalmic suspension, SMARTSIG:In Eye(s), Disp: , Rfl:  Social History   Socioeconomic History   Marital status: Divorced    Spouse name: Not on file   Number of children: Not on file   Years of education: Not on file   Highest education level: Not on file  Occupational History   Not on file  Tobacco Use   Smoking status: Former    Packs/day: 0.25    Types: Cigarettes    Quit date: 05/15/2015    Years since quitting: 6.4   Smokeless tobacco: Never  Vaping Use   Vaping Use: Never used  Substance and Sexual Activity   Alcohol use: Yes    Alcohol/week: 49.0 standard drinks of alcohol    Types: 49 Cans of beer per week   Drug use: No   Sexual activity: Not on file  Other Topics Concern   Not on file  Social History Narrative   Not on file   Social Determinants of Health  Financial Resource Strain: Not on file  Food Insecurity: Not on file  Transportation Needs: Not on file  Physical Activity: Not on file  Stress: Not on file  Social Connections: Not on file  Intimate Partner Violence: Not on file   Family History  Problem Relation Age of Onset   Cancer Mother 32       unsure pt thinks it was colon cancer   Colon cancer Mother    Heart disease Father    Stroke Father    Esophageal cancer Neg Hx    Rectal cancer Neg Hx    Stomach cancer Neg Hx    Colon polyps Neg Hx     Objective: Office vital signs reviewed. BP 125/78   Pulse 96   Temp 98.1 F (36.7 C)   Ht '5\' 8"'$  (1.727 m)   Wt 202 lb 3.2 oz (91.7 kg)   SpO2 94%   BMI 30.74 kg/m   Physical Examination:  General: Awake, alert, well nourished, No acute distress HEENT: No JVD Cardio: regular rate and rhythm, S1S2 heard, no murmurs appreciated Pulm: clear to  auscultation bilaterally, no wheezes, rhonchi or rales; normal work of breathing on room air Extremities: warm, well perfused, No edema, cyanosis or clubbing; +2 pulses bilaterally MSK: Digits 4 and 5 with contractures bilaterally.  He is unable to extend these fingers.  No palpable nodules on the palmar aspect of the hands  Assessment/ Plan: 61 y.o. male   Contracture of joint of left hand - Plan: Ambulatory referral to Hand Surgery  Contracture of joint of hand, right - Plan: Ambulatory referral to Hand Surgery  Dependent edema  Possibly congenital contractures.  He certainly is not able to extend digits 4 and 5 bilaterally at all.  I cannot appreciate any significant nodularity on the palms of his hands however.  Referral to hand surgery per his request for further evaluation and possible management as this does seem to limit his ability to grip  We discussed reasons for dependent edema including excessive salt intake.  He eats quite a bit of fat back and salt in his diet which is most certainly contributory.  We discussed limiting this as well as using compression hose if needed.  He is already on HCTZ  No orders of the defined types were placed in this encounter.  No orders of the defined types were placed in this encounter.    Janora Norlander, DO [facility] [phone]

## 2021-11-01 NOTE — Patient Instructions (Signed)
Edema ? ?Edema is when you have too much fluid in your body or under your skin. Edema may make your legs, feet, and ankles swell. Swelling often happens in looser tissues, such as around your eyes. This is a common condition. It gets more common as you get older. ?There are many possible causes of edema. These include: ?Eating too much salt (sodium). ?Being on your feet or sitting for a long time. ?Certain medical conditions, such as: ?Pregnancy. ?Heart failure. ?Liver disease. ?Kidney disease. ?Cancer. ?Hot weather may make edema worse. Edema is usually painless. Your skin may look swollen or shiny. ?Follow these instructions at home: ?Medicines ?Take over-the-counter and prescription medicines only as told by your doctor. ?Your doctor may prescribe a medicine to help your body get rid of extra water (diuretic). Take this medicine if you are told to take it. ?Eating and drinking ?Eat a low-salt (low-sodium) diet as told by your doctor. Sometimes, eating less salt may reduce swelling. ?Depending on the cause of your swelling, you may need to limit how much fluid you drink (fluid restriction). ?General instructions ?Raise the injured area above the level of your heart while you are sitting or lying down. ?Do not sit still or stand for a long time. ?Do not wear tight clothes. Do not wear garters on your upper legs. ?Exercise your legs. This can help the swelling go down. ?Wear compression stockings as told by your doctor. It is important that these are the right size. These should be prescribed by your doctor to prevent possible injuries. ?If elastic bandages or wraps are recommended, use them as told by your doctor. ?Contact a doctor if: ?Treatment is not working. ?You have heart, liver, or kidney disease and have symptoms of edema. ?You have sudden and unexplained weight gain. ?Get help right away if: ?You have shortness of breath or chest pain. ?You cannot breathe when you lie down. ?You have pain, redness, or  warmth in the swollen areas. ?You have heart, liver, or kidney disease and get edema all of a sudden. ?You have a fever and your symptoms get worse all of a sudden. ?These symptoms may be an emergency. Get help right away. Call 911. ?Do not wait to see if the symptoms will go away. ?Do not drive yourself to the hospital. ?Summary ?Edema is when you have too much fluid in your body or under your skin. ?Edema may make your legs, feet, and ankles swell. Swelling often happens in looser tissues, such as around your eyes. ?Raise the injured area above the level of your heart while you are sitting or lying down. ?Follow your doctor's instructions about diet and how much fluid you can drink. ?This information is not intended to replace advice given to you by your health care provider. Make sure you discuss any questions you have with your health care provider. ?Document Revised: 12/10/2020 Document Reviewed: 12/10/2020 ?Elsevier Patient Education ? 2023 [employer] ? ?

## 2021-11-09 ENCOUNTER — Other Ambulatory Visit: Payer: Self-pay | Admitting: Family Medicine

## 2021-11-13 ENCOUNTER — Ambulatory Visit: Payer: Self-pay

## 2021-11-13 ENCOUNTER — Encounter: Payer: Self-pay | Admitting: Orthopaedic Surgery

## 2021-11-13 ENCOUNTER — Telehealth: Payer: Self-pay | Admitting: Family Medicine

## 2021-11-13 ENCOUNTER — Ambulatory Visit (INDEPENDENT_AMBULATORY_CARE_PROVIDER_SITE_OTHER): Payer: Managed Care, Other (non HMO)

## 2021-11-13 ENCOUNTER — Ambulatory Visit: Payer: Managed Care, Other (non HMO) | Admitting: Orthopaedic Surgery

## 2021-11-13 DIAGNOSIS — M79642 Pain in left hand: Secondary | ICD-10-CM | POA: Diagnosis not present

## 2021-11-13 DIAGNOSIS — M79641 Pain in right hand: Secondary | ICD-10-CM

## 2021-11-13 MED ORDER — BUPROPION HCL ER (XL) 150 MG PO TB24: 450.0000 mg | ORAL_TABLET | Freq: Every day | ORAL | 3 refills | Status: DC

## 2021-11-13 NOTE — Telephone Encounter (Signed)
Meds sent

## 2021-11-13 NOTE — Progress Notes (Addendum)
Office Visit Note   Patient: Alec Gutierrez           Date of Birth: [birth year]           MRN: [id number] Visit Date: 11/13/2021              Requested by: Janora Norlander, DO [place],  [place] [address] PCP: Janora Norlander, DO   Assessment & Plan: Visit Diagnoses:  1. 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  Petra Kuba of the condition  reviewed with the patient.  Generally not a pathologic condition.  He is asymptomatic and does not affect hand function.  We will follow-up as needed.  All questions encouraged and answered.  Follow-Up Instructions: No follow-ups on file.   Orders:  Orders Placed This Encounter  Procedures  . XR Hand Complete Left  . XR Hand Complete Right   No orders of the defined types were placed in this encounter.     Procedures: No procedures performed   Clinical Data: No additional findings.   Subjective: Chief Complaint  Patient presents with  . Left Hand - Pain  . Right Hand - Pain    HPI Alec Gutierrez is a very pleasant 61 year old gentleman right-hand-dominant works as a Advice worker comes in for evaluation of contractures to the bilateral ring and small fingers.  Has been this way since childhood.  Denies any numbness and tingling in the fingers or any problems at the elbows.  Denies any pain.  Review of Systems  Constitutional: Negative.   All other systems reviewed and are negative.    Objective: Vital Signs: There were no vitals taken for this visit.  Physical Exam Vitals and nursing note reviewed.  Constitutional:      Appearance: He is well-developed.  HENT:     Head: Normocephalic and atraumatic.  Eyes:     Pupils: Pupils are equal, round, and reactive to light.  Pulmonary:     Effort: Pulmonary effort is normal.  Abdominal:     Palpations: Abdomen is soft.  Musculoskeletal:        General: Normal range of motion.     Cervical back: Neck  supple.  Skin:    General: Skin is warm.  Neurological:     Mental Status: He is alert and oriented to person, place, and time.  Psychiatric:        Behavior: Behavior normal.        Thought Content: Thought content normal.        Judgment: Judgment normal.    Ortho Exam Examination of bilateral hands show flexible contractures of the ring and small fingers.  Presentation is consistent with camptylodactyly.  There is no elbow symptoms.  No numbness or tingling.  No neurovascular compromise.  Normal capillary refill.  No muscle atrophy. Specialty Comments:  No specialty comments available.  Imaging: XR Hand Complete Right  Result Date: 11/13/2021 Mild arthritic changes at the index MCP joint.  Flexion contractures of the PIP joint of the ring and small fingers.  XR Hand Complete Left  Result Date: 11/13/2021 No acute abnormalities.  Contractures at the PIP joint of the ring and small fingers.    PMFS History: Patient Active Problem List   Diagnosis Date Noted  . Pain in both hands 11/13/2021  . Contracture of joint of finger of right hand 09/03/2021  . Contracture of joint of finger of left  hand 09/03/2021  . OSA on CPAP 05/31/2019  . Right hand pain 11/07/2015  . HLD (hyperlipidemia) 08/06/2015  . Generalized OA 06/05/2015  . HTN (hypertension) 05/07/2015  . Screening for STD (sexually transmitted disease) 02/20/2015  . Depression 01/30/2015  . Snoring 01/04/2015  . PND (paroxysmal nocturnal dyspnea) 01/04/2015  . Fatigue 01/03/2015  . DUODENITIS, WITHOUT HEMORRHAGE 02/09/2008  . B12 DEFICIENCY 01/18/2008  . ANEMIA 01/18/2008  . GERD 06/18/2007  . GUAIAC POSITIVE STOOL 06/18/2007  . ALCOHOL ABUSE, HX OF 06/18/2007  . TOBACCO ABUSE, HX OF 06/18/2007   Past Medical History:  Diagnosis Date  . Arthritis   . GERD (gastroesophageal reflux disease)   . History of BPH   . Hyperlipidemia   . Hypertension   . Sleep apnea    uses CPAP     Family History  Problem  Relation Age of Onset  . Cancer Mother 47       unsure pt thinks it was colon cancer  . Colon cancer Mother   . Heart disease Father   . Stroke Father   . Esophageal cancer Neg Hx   . Rectal cancer Neg Hx   . Stomach cancer Neg Hx   . Colon polyps Neg Hx     Past Surgical History:  Procedure Laterality Date  . COLONOSCOPY  02/09/2008  . EYE SURGERY    . HERNIA REPAIR     inguinal and abd   Social History   Occupational History  . Not on file  Tobacco Use  . Smoking status: Former    Packs/day: 0.25    Types: Cigarettes    Quit date: 05/15/2015    Years since quitting: 6.5  . Smokeless tobacco: Never  Vaping Use  . Vaping Use: Never used  Substance and Sexual Activity  . Alcohol use: Yes    Alcohol/week: 49.0 standard drinks of alcohol    Types: 49 Cans of beer per week  . Drug use: No  . Sexual activity: Not on file

## 2021-11-24 ENCOUNTER — Other Ambulatory Visit: Payer: Self-pay | Admitting: Family Medicine

## 2021-12-06 ENCOUNTER — Other Ambulatory Visit: Payer: Self-pay | Admitting: Family Medicine

## 2021-12-06 DIAGNOSIS — K219 Gastro-esophageal reflux disease without esophagitis: Secondary | ICD-10-CM

## 2022-03-03 ENCOUNTER — Ambulatory Visit (INDEPENDENT_AMBULATORY_CARE_PROVIDER_SITE_OTHER): Payer: Managed Care, Other (non HMO) | Admitting: Family Medicine

## 2022-03-03 ENCOUNTER — Encounter: Payer: Self-pay | Admitting: Family Medicine

## 2022-03-03 VITALS — BP 139/85 | HR 90 | Temp 98.1°F | Ht 68.0 in | Wt 204.2 lb

## 2022-03-03 DIAGNOSIS — Z0000 Encounter for general adult medical examination without abnormal findings: Secondary | ICD-10-CM

## 2022-03-03 DIAGNOSIS — Z0001 Encounter for general adult medical examination with abnormal findings: Secondary | ICD-10-CM | POA: Diagnosis not present

## 2022-03-03 DIAGNOSIS — F1090 Alcohol use, unspecified, uncomplicated: Secondary | ICD-10-CM

## 2022-03-03 DIAGNOSIS — E7800 Pure hypercholesterolemia, unspecified: Secondary | ICD-10-CM

## 2022-03-03 DIAGNOSIS — R739 Hyperglycemia, unspecified: Secondary | ICD-10-CM | POA: Diagnosis not present

## 2022-03-03 DIAGNOSIS — M19049 Primary osteoarthritis, unspecified hand: Secondary | ICD-10-CM

## 2022-03-03 DIAGNOSIS — R918 Other nonspecific abnormal finding of lung field: Secondary | ICD-10-CM

## 2022-03-03 DIAGNOSIS — R0981 Nasal congestion: Secondary | ICD-10-CM

## 2022-03-03 DIAGNOSIS — K219 Gastro-esophageal reflux disease without esophagitis: Secondary | ICD-10-CM

## 2022-03-03 DIAGNOSIS — G4733 Obstructive sleep apnea (adult) (pediatric): Secondary | ICD-10-CM

## 2022-03-03 DIAGNOSIS — I700 Atherosclerosis of aorta: Secondary | ICD-10-CM

## 2022-03-03 LAB — BAYER DCA HB A1C WAIVED: HB A1C (BAYER DCA - WAIVED): 5.6 % (ref 4.8–5.6)

## 2022-03-03 MED ORDER — AMLODIPINE BESYLATE 5 MG PO TABS: 5.0000 mg | ORAL_TABLET | Freq: Every day | ORAL | 3 refills | Status: DC

## 2022-03-03 MED ORDER — HYDROCHLOROTHIAZIDE 25 MG PO TABS: 25.0000 mg | ORAL_TABLET | Freq: Every day | ORAL | 3 refills | Status: DC

## 2022-03-03 MED ORDER — MELOXICAM 15 MG PO TABS: 15.0000 mg | ORAL_TABLET | Freq: Every day | ORAL | 3 refills | Status: DC | PRN

## 2022-03-03 MED ORDER — MOMETASONE FUROATE 50 MCG/ACT NA SUSP: 2.0000 | Freq: Every day | NASAL | 12 refills | Status: DC

## 2022-03-03 MED ORDER — ATORVASTATIN CALCIUM 40 MG PO TABS: 40.0000 mg | ORAL_TABLET | Freq: Every day | ORAL | 3 refills | Status: DC

## 2022-03-03 MED ORDER — OMEPRAZOLE 40 MG PO CPDR: 40.0000 mg | DELAYED_RELEASE_CAPSULE | Freq: Every day | ORAL | 3 refills | Status: DC

## 2022-03-03 MED ORDER — BUPROPION HCL ER (XL) 150 MG PO TB24: 450.0000 mg | ORAL_TABLET | Freq: Every day | ORAL | 3 refills | Status: DC

## 2022-03-03 NOTE — Patient Instructions (Signed)

## 2022-03-03 NOTE — Progress Notes (Signed)
Alec Gutierrez is a 61 y.o. male presents to office today for annual physical exam examination.    Concerns today include: 1.  Fatigue Ongoing fatigue.  His laboratory work-up was totally unremarkable this past spring.  He continues to drink alcohol regularly and is contemplative about coming off of it.  Reports depression is chronic and stable and does not wish to see any specialist.  No SI or HI.  Denies any sensory changes, balance issues.  Compliant with CPAP.  Notes some nasal congestion when he lays down but does not use any nasal sprays.  Current CPAP machine about 62 years old.  Changes tubing and keeps it maintained every 3 months.  Considered inspire device but is not sure that his insurance would cover.  Saw the hand specialist about the contractures in his hands and was told that he would lose some function if he had the surgical release of these contractures so for now he is holding off.  Occupation: Works at [employer],  Substance use: Alcohol Diet: Typical American, Exercise: No structured Last eye exam: Will be seeing his eye doctor in 2 days Last colonoscopy: Up-to-date Refills needed today: All Immunizations needed: Had flu shot already Immunization History  Administered Date(s) Administered   Influenza Whole 02/12/2012   Influenza,inj,Quad PF,6+ Mos 04/20/2013, 01/30/2015, 03/10/2016, 01/15/2017, 01/18/2019, 02/03/2021   Influenza-Unspecified 04/04/2014, 01/31/2018   Moderna SARS-COV2 Booster Vaccination 03/30/2020   Moderna Sars-Covid-2 Vaccination 08/29/2019, 09/27/2019   Tdap 12/14/2006, 01/31/2018   Zoster Recombinat (Shingrix) 05/20/2018, 03/06/2021   Zoster, Live 02/20/2015     Past Medical History:  Diagnosis Date   Arthritis    GERD (gastroesophageal reflux disease)    History of BPH    Hyperlipidemia    Hypertension    Sleep apnea    uses CPAP    Social History   Socioeconomic History   Marital status: Divorced    Spouse name: Not on file    Number of children: Not on file   Years of education: Not on file   Highest education level: Not on file  Occupational History   Not on file  Tobacco Use   Smoking status: Former    Packs/day: 0.25    Types: Cigarettes    Quit date: 05/15/2015    Years since quitting: 6.8   Smokeless tobacco: Never  Vaping Use   Vaping Use: Never used  Substance and Sexual Activity   Alcohol use: Yes    Alcohol/week: 49.0 standard drinks of alcohol    Types: 49 Cans of beer per week   Drug use: No   Sexual activity: Not on file  Other Topics Concern   Not on file  Social History Narrative   Not on file   Social Determinants of Health   Financial Resource Strain: Not on file  Food Insecurity: Not on file  Transportation Needs: Not on file  Physical Activity: Not on file  Stress: Not on file  Social Connections: Not on file  Intimate Partner Violence: Not on file   Past Surgical History:  Procedure Laterality Date   COLONOSCOPY  02/09/2008   EYE SURGERY     HERNIA REPAIR     inguinal and abd   Family History  Problem Relation Age of Onset   Cancer Mother 55       unsure pt thinks it was colon cancer   Colon cancer Mother    Heart disease Father    Stroke Father    Esophageal cancer  Neg Hx    Rectal cancer Neg Hx    Stomach cancer Neg Hx    Colon polyps Neg Hx     Current Outpatient Medications:    amLODipine (NORVASC) 5 MG tablet, TAKE ONE TABLET BY MOUTH DAILY, Disp: 90 tablet, Rfl: 1   atorvastatin (LIPITOR) 40 MG tablet, TAKE ONE TABLET BY MOUTH DAILY, Disp: 90 tablet, Rfl: 0   buPROPion (WELLBUTRIN XL) 150 MG 24 hr tablet, Take 3 tablets (450 mg total) by mouth daily., Disp: 90 tablet, Rfl: 3   hydrochlorothiazide (HYDRODIURIL) 25 MG tablet, TAKE ONE TABLET BY MOUTH DAILY, Disp: 90 tablet, Rfl: 0   meloxicam (MOBIC) 15 MG tablet, Take 1 tablet (15 mg total) by mouth daily., Disp: 90 tablet, Rfl: 2   omeprazole (PRILOSEC) 40 MG capsule, TAKE ONE CAPSULE BY MOUTH DAILY,  Disp: 90 capsule, Rfl: 0  No Known Allergies   ROS: Review of Systems Pertinent items noted in HPI and remainder of comprehensive ROS otherwise negative.    Physical exam BP 139/85   Pulse 90   Temp 98.1 F (36.7 C)   Ht '5\' 8"'$  (1.727 m)   Wt 204 lb 3.2 oz (92.6 kg)   SpO2 97%   BMI 31.05 kg/m  General appearance: alert, cooperative, appears stated age, and no distress Head: Normocephalic, without obvious abnormality, atraumatic Eyes: negative findings: lids and lashes normal, conjunctivae and sclerae normal, corneas clear, and pupils equal, round, reactive to light and accomodation Ears: normal TM's and external ear canals both ears Nose: Nares normal. Septum midline. Mucosa normal. No drainage or sinus tenderness. Throat: lips, mucosa, and tongue normal; teeth and gums normal Neck: no adenopathy, no carotid bruit, supple, symmetrical, trachea midline, and thyroid not enlarged, symmetric, no tenderness/mass/nodules Back: symmetric, no curvature. ROM normal. No CVA tenderness. Lungs: clear to auscultation bilaterally Chest wall: no tenderness Heart: regular rate and rhythm, S1, S2 normal, no murmur, click, rub or gallop Abdomen: soft, non-tender; bowel sounds normal; no masses,  no organomegaly Extremities: extremities normal, atraumatic, no cyanosis or edema Pulses: 2+ and symmetric Skin: Skin color, texture, turgor normal. No rashes or lesions Lymph nodes: Cervical, supraclavicular, and axillary nodes normal. Neurologic: Grossly normal Psych: Affect flat but pleasant and engaging     11/01/2021    8:07 AM 09/03/2021    8:23 AM 03/06/2021    8:37 AM  Depression screen PHQ 2/9  Decreased Interest '3 3 3  '$ Down, Depressed, Hopeless '1 1 1  '$ PHQ - 2 Score '4 4 4  '$ Altered sleeping 0 0 0  Tired, decreased energy '3 1 3  '$ Change in appetite 0 0 0  Feeling bad or failure about yourself  '1 1 1  '$ Trouble concentrating 0 0 0  Moving slowly or fidgety/restless 0 0 0  Suicidal thoughts  0 0 0  PHQ-9 Score '8 6 8  '$ Difficult doing work/chores Somewhat difficult Very difficult Somewhat difficult      11/01/2021    8:07 AM 09/03/2021    8:23 AM 03/06/2021    8:37 AM 11/30/2020    8:28 AM  GAD 7 : Generalized Anxiety Score  Nervous, Anxious, on Edge 0 0 3 0  Control/stop worrying 0 0 0 0  Worry too much - different things 0 0 0 0  Trouble relaxing 0 0 0 0  Restless 0 0 0 0  Easily annoyed or irritable 1 0 3 2  Afraid - awful might happen 0 0 0 0  Total GAD 7 Score  1 0 6 2  Anxiety Difficulty Not difficult at all Somewhat difficult  Somewhat difficult    Assessment/ Plan: Alec Gutierrez here for annual physical exam.   Annual physical exam  Elevated serum glucose - Plan: Bayer DCA Hb A1c Waived  Atherosclerosis of aorta (HCC)  Pure hypercholesterolemia - Plan: Lipid Panel  OSA on CPAP  Nasal congestion - Plan: mometasone (NASONEX) 50 MCG/ACT nasal spray  Hand arthritis - Plan: meloxicam (MOBIC) 15 MG tablet  Gastroesophageal reflux disease without esophagitis - Plan: omeprazole (PRILOSEC) 40 MG capsule  Pulmonary nodules  Alcohol use disorder  Up-to-date on influenza vaccine.  Plan for repeat CT scan in July 2024 to follow-up pulmonary nodules noted  Lipid panel, A1c collected today.  Up-to-date on all other labs.  We discussed reduction in alcohol use.  Patient is contemplative.  Having some nasal congestion and ongoing fatigue.  We discussed consideration of contacting his supplier for his CPAP to see if perhaps his settings need to be adjusted.  In the interim I am going to add Nasonex to use at bedtime in efforts to alleviate some of the nasal congestion he is experiencing.  Hand arthritis and contractures are chronic and stable.  Continue Mobic.  No plans for surgical resection of the contractures at this time  GERD chronic and stable.  No changes.  Renewal sent  Counseled on healthy lifestyle choices, including diet (rich in fruits, vegetables  and lean meats and low in salt and simple carbohydrates) and exercise (at least 30 minutes of moderate physical activity daily).    Blimi Godby M. Lajuana Ripple, DO

## 2022-03-04 LAB — LIPID PANEL
Chol/HDL Ratio: 3.3 ratio (ref 0.0–5.0)
Cholesterol, Total: 169 mg/dL (ref 100–199)
HDL: 51 mg/dL (ref >39)
LDL Chol Calc (NIH): 87 mg/dL (ref 0–99)
Triglycerides: 183 mg/dL — ABNORMAL HIGH (ref 0–149)
VLDL Cholesterol Cal: 31 mg/dL (ref 5–40)

## 2022-11-03 ENCOUNTER — Encounter: Payer: Self-pay | Admitting: Family Medicine

## 2022-11-03 ENCOUNTER — Ambulatory Visit: Payer: Managed Care, Other (non HMO) | Admitting: Family Medicine

## 2022-11-03 VITALS — BP 120/73 | HR 87 | Temp 98.5°F | Ht 68.0 in | Wt 204.0 lb

## 2022-11-03 DIAGNOSIS — R0981 Nasal congestion: Secondary | ICD-10-CM | POA: Diagnosis not present

## 2022-11-03 DIAGNOSIS — R918 Other nonspecific abnormal finding of lung field: Secondary | ICD-10-CM

## 2022-11-03 NOTE — Progress Notes (Signed)
Subjective: CC: Follow-up pulmonary nodules, nasal congestion PCP: Raliegh Ip, DO JYN:WGNFA [name] is a 62 y.o. male presenting to clinic today for:  1.  Multiple pulmonary nodules Patient reports that he has been doing fairly well since her last visit.  He denies any chest pain, shortness of breath, hemoptysis, unplanned weight loss or night sweats.  His CAT scan 6 months ago showed some pulmonary nodules that required 42-month follow-up  2. Nasal congestion Nasonex really was not helpful to alleviate nasal congestion.  Is not especially bothersome and so he does not wish to pursue any ENT evaluation   ROS: Per HPI  No Known Allergies Past Medical History:  Diagnosis Date   Arthritis    GERD (gastroesophageal reflux disease)    History of BPH    Hyperlipidemia    Hypertension    Sleep apnea    uses CPAP     Current Outpatient Medications:    amLODipine (NORVASC) 5 MG tablet, Take 1 tablet (5 mg total) by mouth daily., Disp: 90 tablet, Rfl: 3   atorvastatin (LIPITOR) 40 MG tablet, Take 1 tablet (40 mg total) by mouth daily., Disp: 90 tablet, Rfl: 3   buPROPion (WELLBUTRIN XL) 150 MG 24 hr tablet, Take 3 tablets (450 mg total) by mouth daily., Disp: 270 tablet, Rfl: 3   hydrochlorothiazide (HYDRODIURIL) 25 MG tablet, Take 1 tablet (25 mg total) by mouth daily., Disp: 90 tablet, Rfl: 3   meloxicam (MOBIC) 15 MG tablet, Take 1 tablet (15 mg total) by mouth daily as needed for pain., Disp: 90 tablet, Rfl: 3   omeprazole (PRILOSEC) 40 MG capsule, Take 1 capsule (40 mg total) by mouth daily., Disp: 90 capsule, Rfl: 3 Social History   Socioeconomic History   Marital status: Divorced    Spouse name: Not on file   Number of children: Not on file   Years of education: Not on file   Highest education level: Not on file  Occupational History   Not on file  Tobacco Use   Smoking status: Former    Current packs/day: 0.00    Types: Cigarettes    Quit date: 05/15/2015     Years since quitting: 7.4   Smokeless tobacco: Never  Vaping Use   Vaping status: Never Used  Substance and Sexual Activity   Alcohol use: Yes    Alcohol/week: 49.0 standard drinks of alcohol    Types: 49 Cans of beer per week   Drug use: No   Sexual activity: Not on file  Other Topics Concern   Not on file  Social History Narrative   Not on file   Social Determinants of Health   Financial Resource Strain: Not on file  Food Insecurity: Not on file  Transportation Needs: Not on file  Physical Activity: Not on file  Stress: Not on file  Social Connections: Not on file  Intimate Partner Violence: Not on file   Family History  Problem Relation Age of Onset   Cancer Mother 79       unsure pt thinks it was colon cancer   Colon cancer Mother    Heart disease Father    Stroke Father    Esophageal cancer Neg Hx    Rectal cancer Neg Hx    Stomach cancer Neg Hx    Colon polyps Neg Hx     Objective: Office vital signs reviewed. BP 120/73   Pulse 87   Temp 98.5 F (36.9 C)   Ht 5'  8" (1.727 m)   Wt 204 lb (92.5 kg)   SpO2 96%   BMI 31.02 kg/m   Physical Examination:  General: Awake, alert, well nourished, No acute distress HEENT: Slight nasal deviation present Cardio: regular rate and rhythm, S1S2 heard, no murmurs appreciated Pulm: clear to auscultation bilaterally, no wheezes, rhonchi or rales; normal work of breathing on room air    Assessment/ Plan: 62 y.o. male   Pulmonary nodules - Plan: CT Chest Wo Contrast  Nasal congestion  CT chest without contrast ordered.  Follow-up pending results  Nasal congestion chronic and likely secondary to nasal septal deviation.  Declines referral.  Follow-up as needed and this issue  May follow-up in 6 months for annual physical with fasting labs, sooner if concerns arise  Orders Placed This Encounter  Procedures   CT Chest Wo Contrast    Standing Status:   Future    Standing Expiration Date:   11/03/2023    Order  Specific Question:   Preferred imaging location?    Answer:   [facility]   No orders of the defined types were placed in this encounter.    Raliegh Ip, DO [facility] [phone]

## 2022-11-14 ENCOUNTER — Other Ambulatory Visit: Payer: Managed Care, Other (non HMO)

## 2022-11-18 ENCOUNTER — Telehealth: Payer: Self-pay | Admitting: Family Medicine

## 2022-11-18 NOTE — Telephone Encounter (Signed)
I spoke with Patient - Patient was confused because someone gave him a call - I explained to Patient that he was rescheduled at [facility] and his Berkley Harvey was present and within date. Patient voiced understanding.

## 2022-11-20 ENCOUNTER — Other Ambulatory Visit (HOSPITAL_BASED_OUTPATIENT_CLINIC_OR_DEPARTMENT_OTHER): Payer: Managed Care, Other (non HMO)

## 2022-12-09 ENCOUNTER — Ambulatory Visit
Admission: RE | Admit: 2022-12-09 | Discharge: 2022-12-09 | Disposition: A | Discharge status: Home / Self Care | Payer: Managed Care, Other (non HMO) | Source: Ambulatory Visit | Attending: Family Medicine | Admitting: Family Medicine

## 2022-12-09 DIAGNOSIS — R918 Other nonspecific abnormal finding of lung field: Secondary | ICD-10-CM

## 2023-02-04 ENCOUNTER — Encounter: Payer: Self-pay | Admitting: *Deleted

## 2023-03-17 ENCOUNTER — Ambulatory Visit: Payer: Managed Care, Other (non HMO) | Admitting: Family Medicine

## 2023-03-17 ENCOUNTER — Encounter: Payer: Self-pay | Admitting: Family Medicine

## 2023-03-17 VITALS — BP 135/79 | HR 94 | Temp 98.5°F | Ht 68.0 in | Wt 211.0 lb

## 2023-03-17 DIAGNOSIS — M19049 Primary osteoarthritis, unspecified hand: Secondary | ICD-10-CM

## 2023-03-17 DIAGNOSIS — H1033 Unspecified acute conjunctivitis, bilateral: Secondary | ICD-10-CM | POA: Diagnosis not present

## 2023-03-17 MED ORDER — MELOXICAM 15 MG PO TABS: 15.0000 mg | ORAL_TABLET | Freq: Every day | ORAL | 0 refills | Status: DC | PRN

## 2023-03-17 MED ORDER — POLYMYXIN B-TRIMETHOPRIM 10000-0.1 UNIT/ML-% OP SOLN: 1.0000 [drp] | OPHTHALMIC | 0 refills | Status: AC

## 2023-03-17 NOTE — Progress Notes (Signed)
Subjective:  Patient ID: Alec Gutierrez, male    DOB: 1960-06-22, 62 y.o.   MRN: [id number]  Patient Care Team: Raliegh Ip, DO as PCP - General (Family Medicine)   Chief Complaint:  Conjunctivitis   HPI: Alec Gutierrez is a 62 y.o. male presenting on 03/17/2023 for Conjunctivitis States that Sunday started with "bloodshot" itching, burning eye and sinus headache.  States that it is itching and still has a headache. Right eye was slightly swollen. States that it is draining and he has to wipe it constantly. States that it feels gritty and grainy.   Reports that he needs a refill on meloxicam. States that he takes prilosec daily.   Relevant past medical, surgical, family, and social history reviewed and updated as indicated.  Allergies and medications reviewed and updated. Data reviewed: Chart in Epic.   Past Medical History:  Diagnosis Date   Arthritis    GERD (gastroesophageal reflux disease)    History of BPH    Hyperlipidemia    Hypertension    Sleep apnea    uses CPAP     Past Surgical History:  Procedure Laterality Date   COLONOSCOPY  02/09/2008   EYE SURGERY     HERNIA REPAIR     inguinal and abd    Social History   Socioeconomic History   Marital status: Divorced    Spouse name: Not on file   Number of children: Not on file   Years of education: Not on file   Highest education level: Not on file  Occupational History   Not on file  Tobacco Use   Smoking status: Former    Current packs/day: 0.00    Types: Cigarettes    Quit date: 05/15/2015    Years since quitting: 7.8   Smokeless tobacco: Never  Vaping Use   Vaping status: Never Used  Substance and Sexual Activity   Alcohol use: Yes    Alcohol/week: 49.0 standard drinks of alcohol    Types: 49 Cans of beer per week   Drug use: No   Sexual activity: Not on file  Other Topics Concern   Not on file  Social History Narrative   Not on file   Social Determinants of Health   Financial  Resource Strain: Not on file  Food Insecurity: Not on file  Transportation Needs: Not on file  Physical Activity: Not on file  Stress: Not on file  Social Connections: Not on file  Intimate Partner Violence: Not on file    Outpatient Encounter Medications as of 03/17/2023  Medication Sig   amLODipine (NORVASC) 5 MG tablet Take 1 tablet (5 mg total) by mouth daily.   atorvastatin (LIPITOR) 40 MG tablet Take 1 tablet (40 mg total) by mouth daily.   buPROPion (WELLBUTRIN XL) 150 MG 24 hr tablet Take 3 tablets (450 mg total) by mouth daily.   hydrochlorothiazide (HYDRODIURIL) 25 MG tablet Take 1 tablet (25 mg total) by mouth daily.   meloxicam (MOBIC) 15 MG tablet Take 1 tablet (15 mg total) by mouth daily as needed for pain.   omeprazole (PRILOSEC) 40 MG capsule Take 1 capsule (40 mg total) by mouth daily.   No facility-administered encounter medications on file as of 03/17/2023.    No Known Allergies  Review of Systems As per HPI  Objective:  BP (!) 144/82   Pulse 94   Temp 98.5 F (36.9 C)   Ht 5\' 8"  (1.727 m)   Wt 211  lb (95.7 kg)   SpO2 96%   BMI 32.08 kg/m    Wt Readings from Last 3 Encounters:  03/17/23 211 lb (95.7 kg)  11/03/22 204 lb (92.5 kg)  03/03/22 204 lb 3.2 oz (92.6 kg)   Physical Exam Constitutional:      General: He is awake. He is not in acute distress.    Appearance: Normal appearance. He is well-developed and well-groomed. He is not ill-appearing, toxic-appearing or diaphoretic.  Eyes:     Extraocular Movements:     Right eye: Normal extraocular motion.     Left eye: Normal extraocular motion.     Conjunctiva/sclera:     Right eye: Right conjunctiva is injected. Chemosis present. No exudate or hemorrhage.    Left eye: Left conjunctiva is injected. Chemosis present. No exudate or hemorrhage. Cardiovascular:     Pulses: Normal pulses.          Radial pulses are 2+ on the right side and 2+ on the left side.       Posterior tibial pulses are 2+ on  the right side and 2+ on the left side.     Heart sounds: No murmur heard.    No gallop.  Pulmonary:     Effort: Pulmonary effort is normal. No respiratory distress.     Breath sounds: Normal breath sounds. No stridor. No wheezing, rhonchi or rales.  Musculoskeletal:     Cervical back: Full passive range of motion without pain and neck supple.     Right lower leg: No edema.     Left lower leg: No edema.  Skin:    General: Skin is warm.     Capillary Refill: Capillary refill takes less than 2 seconds.  Neurological:     General: No focal deficit present.     Mental Status: He is alert, oriented to person, place, and time and easily aroused. Mental status is at baseline.     GCS: GCS eye subscore is 4. GCS verbal subscore is 5. GCS motor subscore is 6.     Motor: No weakness.  Psychiatric:        Attention and Perception: Attention and perception normal.        Mood and Affect: Mood and affect normal.        Speech: Speech normal.        Behavior: Behavior normal. Behavior is cooperative.        Thought Content: Thought content normal. Thought content does not include homicidal or suicidal ideation. Thought content does not include homicidal or suicidal plan.        Cognition and Memory: Cognition and memory normal.        Judgment: Judgment normal.      Results for orders placed or performed in visit on 03/03/22  Lipid Panel  Result Value Ref Range   Cholesterol, Total 169 100 - 199 mg/dL   Triglycerides 086 (H) 0 - 149 mg/dL   HDL 51 >57 mg/dL   VLDL Cholesterol Cal 31 5 - 40 mg/dL   LDL Chol Calc (NIH) 87 0 - 99 mg/dL   Chol/HDL Ratio 3.3 0.0 - 5.0 ratio  Bayer DCA Hb A1c Waived  Result Value Ref Range   HB A1C (BAYER DCA - WAIVED) 5.6 4.8 - 5.6 %       11/03/2022    7:58 AM 11/01/2021    8:07 AM 09/03/2021    8:23 AM 03/06/2021    8:37 AM 11/30/2020    8:27  AM  Depression screen PHQ 2/9  Decreased Interest 3 3 3 3 3   Down, Depressed, Hopeless 1 1 1 1 2   PHQ - 2  Score 4 4 4 4 5   Altered sleeping 0 0 0 0 1  Tired, decreased energy 3 3 1 3 3   Change in appetite 1 0 0 0 0  Feeling bad or failure about yourself  0 1 1 1 2   Trouble concentrating 0 0 0 0 0  Moving slowly or fidgety/restless 0 0 0 0 2  Suicidal thoughts 0 0 0 0 0  PHQ-9 Score 8 8 6 8 13   Difficult doing work/chores Not difficult at all Somewhat difficult Very difficult Somewhat difficult Somewhat difficult       11/03/2022    8:00 AM 11/01/2021    8:07 AM 09/03/2021    8:23 AM 03/06/2021    8:37 AM  GAD 7 : Generalized Anxiety Score  Nervous, Anxious, on Edge 0 0 0 3  Control/stop worrying 0 0 0 0  Worry too much - different things 0 0 0 0  Trouble relaxing 0 0 0 0  Restless 0 0 0 0  Easily annoyed or irritable 0 1 0 3  Afraid - awful might happen 0 0 0 0  Total GAD 7 Score 0 1 0 6  Anxiety Difficulty Not difficult at all Not difficult at all Somewhat difficult    Pertinent labs & imaging results that were available during my care of the patient were reviewed by me and considered in my medical decision making.  Assessment & Plan:  Jahleel was seen today for conjunctivitis.  Diagnoses and all orders for this visit:  Acute bacterial conjunctivitis of both eyes Will start medication as below.  -     trimethoprim-polymyxin b (POLYTRIM) ophthalmic solution; Place 1 drop into both eyes every 4 (four) hours for 7 days.  Hand arthritis Will refill as below.  -     meloxicam (MOBIC) 15 MG tablet; Take 1 tablet (15 mg total) by mouth daily as needed for pain.   Continue all other maintenance medications.  Follow up plan: Return if symptoms worsen or fail to improve.  Continue healthy lifestyle choices, including diet (rich in fruits, vegetables, and lean proteins, and low in salt and simple carbohydrates) and exercise (at least 30 minutes of moderate physical activity daily).  Written and verbal instructions provided   The above assessment and management plan was discussed  with the patient. The patient verbalized understanding of and has agreed to the management plan. Patient is aware to call the clinic if they develop any new symptoms or if symptoms persist or worsen. Patient is aware when to return to the clinic for a follow-up visit. Patient educated on when it is appropriate to go to the emergency department.   Neale Burly, DNP-FNP [facility] [address] [place], [place] [address] [phone]

## 2023-03-17 NOTE — Patient Instructions (Signed)
Start Zyrtec or Xyzal. Take at night every day.

## 2023-04-15 ENCOUNTER — Other Ambulatory Visit: Payer: Self-pay | Admitting: Family Medicine

## 2023-04-15 DIAGNOSIS — K219 Gastro-esophageal reflux disease without esophagitis: Secondary | ICD-10-CM

## 2023-05-06 ENCOUNTER — Encounter: Payer: Self-pay | Admitting: *Deleted

## 2023-05-21 ENCOUNTER — Encounter: Payer: Managed Care, Other (non HMO) | Admitting: Family Medicine

## 2023-05-25 ENCOUNTER — Encounter: Payer: Managed Care, Other (non HMO) | Admitting: Family Medicine

## 2023-06-18 ENCOUNTER — Ambulatory Visit (INDEPENDENT_AMBULATORY_CARE_PROVIDER_SITE_OTHER): Payer: Managed Care, Other (non HMO) | Admitting: Family Medicine

## 2023-06-18 ENCOUNTER — Encounter: Payer: Self-pay | Admitting: Family Medicine

## 2023-06-18 VITALS — BP 139/78 | HR 100 | Temp 97.9°F | Ht 68.0 in | Wt 210.6 lb

## 2023-06-18 DIAGNOSIS — E7800 Pure hypercholesterolemia, unspecified: Secondary | ICD-10-CM | POA: Diagnosis not present

## 2023-06-18 DIAGNOSIS — K219 Gastro-esophageal reflux disease without esophagitis: Secondary | ICD-10-CM

## 2023-06-18 DIAGNOSIS — Z23 Encounter for immunization: Secondary | ICD-10-CM

## 2023-06-18 DIAGNOSIS — Z0001 Encounter for general adult medical examination with abnormal findings: Secondary | ICD-10-CM

## 2023-06-18 DIAGNOSIS — I700 Atherosclerosis of aorta: Secondary | ICD-10-CM | POA: Diagnosis not present

## 2023-06-18 DIAGNOSIS — Z0000 Encounter for general adult medical examination without abnormal findings: Secondary | ICD-10-CM

## 2023-06-18 DIAGNOSIS — I10 Essential (primary) hypertension: Secondary | ICD-10-CM

## 2023-06-18 DIAGNOSIS — Z125 Encounter for screening for malignant neoplasm of prostate: Secondary | ICD-10-CM

## 2023-06-18 DIAGNOSIS — F329 Major depressive disorder, single episode, unspecified: Secondary | ICD-10-CM

## 2023-06-18 DIAGNOSIS — F1090 Alcohol use, unspecified, uncomplicated: Secondary | ICD-10-CM

## 2023-06-18 DIAGNOSIS — G4733 Obstructive sleep apnea (adult) (pediatric): Secondary | ICD-10-CM

## 2023-06-18 LAB — LIPID PANEL

## 2023-06-18 MED ORDER — ATORVASTATIN CALCIUM 40 MG PO TABS
40.0000 mg | ORAL_TABLET | Freq: Every day | ORAL | 3 refills | Status: AC
Start: 2023-06-18 — End: ?

## 2023-06-18 MED ORDER — BUPROPION HCL ER (XL) 150 MG PO TB24
450.0000 mg | ORAL_TABLET | Freq: Every day | ORAL | 3 refills | Status: AC
Start: 2023-06-18 — End: ?

## 2023-06-18 MED ORDER — HYDROCHLOROTHIAZIDE 25 MG PO TABS
25.0000 mg | ORAL_TABLET | Freq: Every day | ORAL | 3 refills | Status: AC
Start: 2023-06-18 — End: ?

## 2023-06-18 MED ORDER — OMEPRAZOLE 40 MG PO CPDR: 40.0000 mg | DELAYED_RELEASE_CAPSULE | Freq: Every day | ORAL | 3 refills | Status: AC

## 2023-06-18 MED ORDER — AMLODIPINE BESYLATE 5 MG PO TABS: 5.0000 mg | ORAL_TABLET | Freq: Every day | ORAL | 3 refills | Status: AC

## 2023-06-18 NOTE — Progress Notes (Signed)
 Alec Gutierrez is a 63 y.o. male presents to office today for annual physical exam examination.    Concerns today include: 1.  None.  Overall he does well.  He continues to work for [employer].  Occupation: Designer, jewellery, Substance use: 8-10 beers per night, occasional liquor There are no preventive care reminders to display for this patient.  Refills needed today: All  Immunization History  Administered Date(s) Administered   Fluad Quad(high Dose 65+) 01/31/2022   Influenza Whole 02/12/2012   Influenza,inj,Quad PF,6+ Mos 04/20/2013, 01/30/2015, 03/10/2016, 01/15/2017, 01/18/2019, 02/03/2021, 01/19/2023   Influenza-Unspecified 04/04/2014, 01/31/2018   Moderna SARS-COV2 Booster Vaccination 03/30/2020   Moderna Sars-Covid-2 Vaccination 08/29/2019, 09/27/2019   Tdap 12/14/2006, 01/31/2018   Zoster Recombinant(Shingrix) 05/20/2018, 03/06/2021   Zoster, Live 02/20/2015   Past Medical History:  Diagnosis Date   Arthritis    GERD (gastroesophageal reflux disease)    History of BPH    Hyperlipidemia    Hypertension    Sleep apnea    uses CPAP    Social History   Socioeconomic History   Marital status: Divorced    Spouse name: Not on file   Number of children: Not on file   Years of education: Not on file   Highest education level: Not on file  Occupational History   Not on file  Tobacco Use   Smoking status: Former    Current packs/day: 0.00    Types: Cigarettes    Quit date: 05/15/2015    Years since quitting: 8.0   Smokeless tobacco: Never  Vaping Use   Vaping status: Never Used  Substance and Sexual Activity   Alcohol use: Yes    Alcohol/week: 49.0 standard drinks of alcohol    Types: 49 Cans of beer per week   Drug use: No   Sexual activity: Not on file  Other Topics Concern   Not on file  Social History Narrative   Not on file   Social Drivers of Health   Financial Resource Strain: Not on file  Food Insecurity: Not on file  Transportation Needs:  Not on file  Physical Activity: Not on file  Stress: Not on file  Social Connections: Not on file  Intimate Partner Violence: Not on file   Past Surgical History:  Procedure Laterality Date   COLONOSCOPY  02/09/2008   EYE SURGERY     HERNIA REPAIR     inguinal and abd   Family History  Problem Relation Age of Onset   Cancer Mother 100       unsure pt thinks it was colon cancer   Colon cancer Mother    Heart disease Father    Stroke Father    Esophageal cancer Neg Hx    Rectal cancer Neg Hx    Stomach cancer Neg Hx    Colon polyps Neg Hx     Current Outpatient Medications:    meloxicam (MOBIC) 15 MG tablet, Take 1 tablet (15 mg total) by mouth daily as needed for pain., Disp: 90 tablet, Rfl: 0   amLODipine (NORVASC) 5 MG tablet, Take 1 tablet (5 mg total) by mouth daily., Disp: 90 tablet, Rfl: 3   atorvastatin (LIPITOR) 40 MG tablet, Take 1 tablet (40 mg total) by mouth daily., Disp: 90 tablet, Rfl: 3   buPROPion (WELLBUTRIN XL) 150 MG 24 hr tablet, Take 3 tablets (450 mg total) by mouth daily., Disp: 270 tablet, Rfl: 3   hydrochlorothiazide (HYDRODIURIL) 25 MG tablet, Take 1 tablet (25 mg  total) by mouth daily., Disp: 90 tablet, Rfl: 3   omeprazole (PRILOSEC) 40 MG capsule, Take 1 capsule (40 mg total) by mouth daily., Disp: 90 capsule, Rfl: 3  No Known Allergies   ROS: Review of Systems A comprehensive review of systems was negative except for: Respiratory: positive for dyspnea with moderate exertion    Physical exam BP 139/78   Pulse 100   Temp 97.9 F (36.6 C)   Ht 5\' 8"  (1.727 m)   Wt 210 lb 9.6 oz (95.5 kg)   SpO2 97%   BMI 32.02 kg/m  General appearance: alert, cooperative, appears stated age, no distress, and mildly obese Head: Normocephalic, without obvious abnormality, atraumatic Eyes: negative findings: lids and lashes normal, conjunctivae and sclerae normal, corneas clear, and pupils equal, round, reactive to light and accomodation Ears: normal TM's and  external ear canals both ears Nose: Nares normal. Septum midline. Mucosa normal. No drainage or sinus tenderness. Throat: lips, mucosa, and tongue normal; teeth and gums normal Neck: no adenopathy, supple, symmetrical, trachea midline, and thyroid not enlarged, symmetric, no tenderness/mass/nodules Back: symmetric, no curvature. ROM normal. No CVA tenderness. Lungs: clear to auscultation bilaterally Chest wall: no tenderness Heart: regular rate and rhythm, S1, S2 normal, no murmur, click, rub or gallop Abdomen: soft, non-tender; bowel sounds normal; no masses,  no organomegaly Extremities: extremities normal, atraumatic, no cyanosis or edema Pulses: 2+ and symmetric Skin: Skin color, texture, turgor normal. No rashes or lesions Lymph nodes: Cervical, supraclavicular, and axillary nodes normal. Neurologic: Grossly normal      06/18/2023    7:58 AM 11/03/2022    7:58 AM 11/01/2021    8:07 AM  Depression screen PHQ 2/9  Decreased Interest 0 3 3  Down, Depressed, Hopeless 0 1 1  PHQ - 2 Score 0 4 4  Altered sleeping 0 0 0  Tired, decreased energy 0 3 3  Change in appetite 0 1 0  Feeling bad or failure about yourself  0 0 1  Trouble concentrating 0 0 0  Moving slowly or fidgety/restless 0 0 0  Suicidal thoughts 0 0 0  PHQ-9 Score 0 8 8  Difficult doing work/chores Not difficult at all Not difficult at all Somewhat difficult      06/18/2023    7:58 AM 11/03/2022    8:00 AM 11/01/2021    8:07 AM 09/03/2021    8:23 AM  GAD 7 : Generalized Anxiety Score  Nervous, Anxious, on Edge 0 0 0 0  Control/stop worrying 0 0 0 0  Worry too much - different things 0 0 0 0  Trouble relaxing 0 0 0 0  Restless 0 0 0 0  Easily annoyed or irritable 0 0 1 0  Afraid - awful might happen 0 0 0 0  Total GAD 7 Score 0 0 1 0  Anxiety Difficulty Not difficult at all Not difficult at all Not difficult at all Somewhat difficult     Assessment/ Plan: Sheliah Gutierrez here for annual physical exam.    Annual physical exam  Primary hypertension - Plan: CMP14+EGFR, amLODipine (NORVASC) 5 MG tablet, hydrochlorothiazide (HYDRODIURIL) 25 MG tablet  Pure hypercholesterolemia - Plan: CMP14+EGFR, Lipid Panel, TSH, atorvastatin (LIPITOR) 40 MG tablet  Atherosclerosis of aorta (HCC) - Plan: CMP14+EGFR, Lipid Panel, TSH, atorvastatin (LIPITOR) 40 MG tablet  OSA on CPAP - Plan: CBC  Alcohol use disorder - Plan: CBC, Vitamin B12, Folate  Screening for malignant neoplasm of prostate - Plan: PSA  Gastroesophageal reflux disease  without esophagitis - Plan: omeprazole (PRILOSEC) 40 MG capsule  Encounter for Prevnar pneumococcal vaccination - Plan: Pneumococcal conjugate vaccine 20-valent  Reactive depression - Plan: buPROPion (WELLBUTRIN XL) 150 MG 24 hr tablet  He had pneumococcal vaccination administered today  Nonfasting labs were collected.  His blood pressure was well-controlled so I renewed his medicines  He will continue statin for prevention of progression of aortic atherosclerosis  Continue CPAP machine as directed  I counseled him at length on alcohol use and its impact on various organ systems.  I am going to check vitamin levels on him.  We discussed consideration for gradual wean from alcohol  I renewed his PPI given risk of esophageal and gastric varices in the setting of alcohol use  Reactive depression was stable so I renewed his Wellbutrin  Counseled on healthy lifestyle choices, including diet (rich in fruits, vegetables and lean meats and low in salt and simple carbohydrates) and exercise (at least 30 minutes of moderate physical activity daily).  Patient to follow up 3-4 months  Ulysees Robarts M. Nadine Counts, DO

## 2023-06-18 NOTE — Patient Instructions (Signed)
Alcohol Use Disorder Alcohol use disorder is a condition in which drinking disrupts daily life. People with this condition drink too much alcohol and cannot control their drinking. Alcohol use disorder can cause serious problems with physical health. It can affect the brain, heart, and other internal organs. This disorder can raise the risk for certain cancers and cause problems with mental health, such as depression or anxiety. What are the causes? This condition is caused by drinking too much alcohol over time. Some people with this condition drink to cope with or escape from negative life events. Others drink to relieve symptoms of physical pain or symptoms of mental illness. What increases the risk? You are more likely to develop this condition if: You have a family history of alcohol use disorder. Your culture encourages drinking to the point of becoming drunk (intoxication). You had a mood or conduct disorder in childhood. You have been abused. You are an adolescent and you: Have poor performance in school. Have poor supervision or guidance. Act on impulse and like taking risks. What are the signs or symptoms? Symptoms of this condition include: Drinking more than you want to. Trying several times without success to drink less. Spending a lot of time thinking about alcohol, getting alcohol, drinking alcohol, or recovering from drinking alcohol. Continuing to drink even when it is causing serious problems in your daily life. Drinking when it is dangerous to drink, such as before driving a car. Needing more and more alcohol to get the same effect you want (building up tolerance). Having symptoms of withdrawal when you stop drinking. Withdrawal symptoms may include: Trouble sleeping, leading to tiredness (fatigue). Mood swings of depression and anxiety. Physical symptoms, such as a fast heart rate, rapid breathing, high blood pressure (hypertension), fever, cold sweats, or  nausea. Seizures. Severe confusion. Feeling or seeing things that are not there (hallucinations). Shaking movements that you cannot control (tremors). How is this diagnosed? This condition is diagnosed with an assessment. Your health care provider may start by asking three or four questions about your drinking, or they may give you a simple test to take. This helps to get clear information from you. You may also have a physical exam or lab tests. You may be referred to a substance abuse counselor. How is this treated? With education, some people with alcohol use disorder are able to reduce their drinking. Many with this disorder cannot change their drinking behavior on their own and need help with treatment from substance use specialists. Treatments may include: Detoxification. Detoxification involves quitting drinking with supervision and direction of health care providers. Your health care provider may prescribe medicines within the first week to help lessen withdrawal symptoms. Alcohol withdrawal can be dangerous and life-threatening. Detoxification may be provided in a home, community, or primary care setting, or in a hospital or substance use treatment facility. Counseling. This may involve motivational interviewing (MI), family therapy, or cognitive behavioral therapy (CBT). A counselor can address the things you can do to change your drinking behavior and how to maintain the changes. Talk therapy aims to: Identify your positive motivations to change. Identify and avoid the things that trigger your drinking. Help you learn how to plan your behavior change. Develop support systems that can help you sustain the change. Medicines. Medicines can help treat this disorder by: Decreasing cravings. Decreasing the positive feeling you have when you drink. Causing an uncomfortable physical reaction when you drink (aversion therapy). Support groups such as Alcoholics Anonymous (AA). These groups are  led by people who have quit drinking. The groups provide emotional support, advice, and guidance. Some people with this condition benefit from a combination of treatments provided by specialized substance use treatment centers. Follow these instructions at home:  Medicines Take over-the-counter and prescription medicines only as told by your health care provider. Ask before starting any new medicines, herbs, or supplements. General instructions Ask friends and family members to support your choice to stay sober. Avoid places where alcohol is served. Create a plan to deal with tempting situations. Attend support groups regularly. Practice hobbies or activities you enjoy. Do not drink and drive. How is this prevented? Do not drink alcohol if your health care provider tells you not to drink. If you drink alcohol: Limit how much you have to: 0-1 drink a day for women who are not pregnant. 0-2 drinks a day for men. Know how much alcohol is in your drink. In the [place] one drink equals one 12 oz bottle of beer (355 mL), one 5 oz glass of wine (148 mL), or one 1 oz glass of hard liquor (44 mL). If you have a mental health condition, seek treatment. Develop a healthy lifestyle through: Meditation or deep breathing. Exercise. Spending time in nature. Listening to music. Talking with a trusted friend or family member. If you are a teen: Do not drink alcohol. Avoid gatherings where you might be tempted to drink alcohol. Do not be afraid to say no if someone offers you alcohol. Speak up about why you do not want to drink. Set a positive example for others around you by not drinking. Build relationships with friends who do not drink. Where to find more information Substance Abuse and Mental Health Services Administration: RockToxic.pl Alcoholics Anonymous: CustomizedRugs.fi Contact a health care provider if: You cannot take your medicines as told. Your symptoms get worse or you experience symptoms of  withdrawal when you stop drinking. You start drinking again (relapse) and your symptoms get worse. Get help right away if: You have thoughts about hurting yourself or others. Get help right away if you feel like you may hurt yourself or others, or have thoughts about taking your own life. Go to your nearest emergency room or: Call 911. Call the National Suicide Prevention Lifeline at 385-054-0790 or 988. This is open 24 hours a day. Text the Crisis Text Line at 6102983571. Summary Alcohol use disorder is a condition in which drinking disrupts daily life. People with this condition drink too much alcohol and cannot control their drinking. Treatment may include detoxification, counseling, medicines, and support groups. Ask friends and family members to support you. Avoid situations where alcohol is served. Get help right away if you have thoughts about hurting yourself or others. This information is not intended to replace advice given to you by your health care provider. Make sure you discuss any questions you have with your health care provider. Document Revised: 06/12/2021 Document Reviewed: 06/12/2021 Elsevier Patient Education  2024 [employer].

## 2023-06-19 ENCOUNTER — Other Ambulatory Visit: Payer: Self-pay | Admitting: Family Medicine

## 2023-06-19 ENCOUNTER — Encounter: Payer: Self-pay | Admitting: Family Medicine

## 2023-06-19 ENCOUNTER — Telehealth: Payer: Self-pay

## 2023-06-19 DIAGNOSIS — M19049 Primary osteoarthritis, unspecified hand: Secondary | ICD-10-CM

## 2023-06-19 LAB — CBC
Hematocrit: 41 % (ref 37.5–51.0)
Hemoglobin: 13.5 g/dL (ref 13.0–17.7)
MCH: 28.9 pg (ref 26.6–33.0)
MCHC: 32.9 g/dL (ref 31.5–35.7)
MCV: 88 fL (ref 79–97)
Platelets: 351 10*3/uL (ref 150–450)
RBC: 4.67 x10E6/uL (ref 4.14–5.80)
RDW: 13.1 % (ref 11.6–15.4)
WBC: 11.3 10*3/uL — ABNORMAL HIGH (ref 3.4–10.8)

## 2023-06-19 LAB — LIPID PANEL
Cholesterol, Total: 179 mg/dL (ref 100–199)
HDL: 48 mg/dL (ref >39)
LDL CALC COMMENT:: 3.7 ratio (ref 0.0–5.0)
LDL Chol Calc (NIH): 92 mg/dL (ref 0–99)
Triglycerides: 230 mg/dL — ABNORMAL HIGH (ref 0–149)
VLDL Cholesterol Cal: 39 mg/dL (ref 5–40)

## 2023-06-19 LAB — CMP14+EGFR
ALT: 29 [IU]/L (ref 0–44)
AST: 19 [IU]/L (ref 0–40)
Albumin: 4.4 g/dL (ref 3.9–4.9)
Alkaline Phosphatase: 77 [IU]/L (ref 44–121)
BUN/Creatinine Ratio: 18 (ref 10–24)
BUN: 19 mg/dL (ref 8–27)
Bilirubin Total: 0.2 mg/dL (ref 0.0–1.2)
CO2: 21 mmol/L (ref 20–29)
Calcium: 9.2 mg/dL (ref 8.6–10.2)
Chloride: 102 mmol/L (ref 96–106)
Creatinine, Ser: 1.05 mg/dL (ref 0.76–1.27)
Globulin, Total: 2 g/dL (ref 1.5–4.5)
Glucose: 106 mg/dL — ABNORMAL HIGH (ref 70–99)
Potassium: 3.8 mmol/L (ref 3.5–5.2)
Sodium: 140 mmol/L (ref 134–144)
Total Protein: 6.4 g/dL (ref 6.0–8.5)
eGFR: 80 mL/min/{1.73_m2} (ref >59)

## 2023-06-19 LAB — FOLATE: Folate: 5.3 ng/mL (ref >3.0)

## 2023-06-19 LAB — PSA: Prostate Specific Ag, Serum: 0.7 ng/mL (ref 0.0–4.0)

## 2023-06-19 LAB — VITAMIN B12: Vitamin B-12: 506 pg/mL (ref 232–1245)

## 2023-06-19 LAB — TSH: TSH: 2.93 u[IU]/mL (ref 0.450–4.500)

## 2023-06-19 NOTE — Telephone Encounter (Signed)
 Copied from CRM [id number]. Topic: Clinical - Lab/Test Results >> Jun 19, 2023  3:22 PM Ivette P wrote: Reason for CRM: Pt called back due to a missed call, read over results as follows.   "Sugar elevated on metabolic panel Kidney function, liver enzymes and electrolytes are normal Triglycerides remain elevated and this is again likely related to use of alcohol and excess intake of carbohydrates White blood cell count is elevated. Uncertain as to why. He did not report feeling ill. Will plan to recheck this at his next visit Thyroid-stimulating hormone normal His vitamin levels are normal but I still think he would benefit from a multivitamin Prostate antigen remains normal range"  Pt understood and notified of results.

## 2023-06-26 NOTE — Telephone Encounter (Signed)
 Copied from CRM [id number]. Topic: Clinical - Lab/Test Results >> Jun 26, 2023 11:34 AM Shelah Lewandowsky wrote: Reason for CRM: patient returned call about labs, read over results, he said he understood everything.  If there is anything else please call patient [phone]

## 2023-06-29 ENCOUNTER — Encounter: Payer: Self-pay | Admitting: *Deleted

## 2023-11-09 IMAGING — DX DG CHEST 2V
3 series · 3 of 3 positions shown · non-contrast
Comparison: April 02, 2010

CLINICAL DATA: Shortness of breath.

EXAM:
CHEST - 2 VIEW

[chest ap]
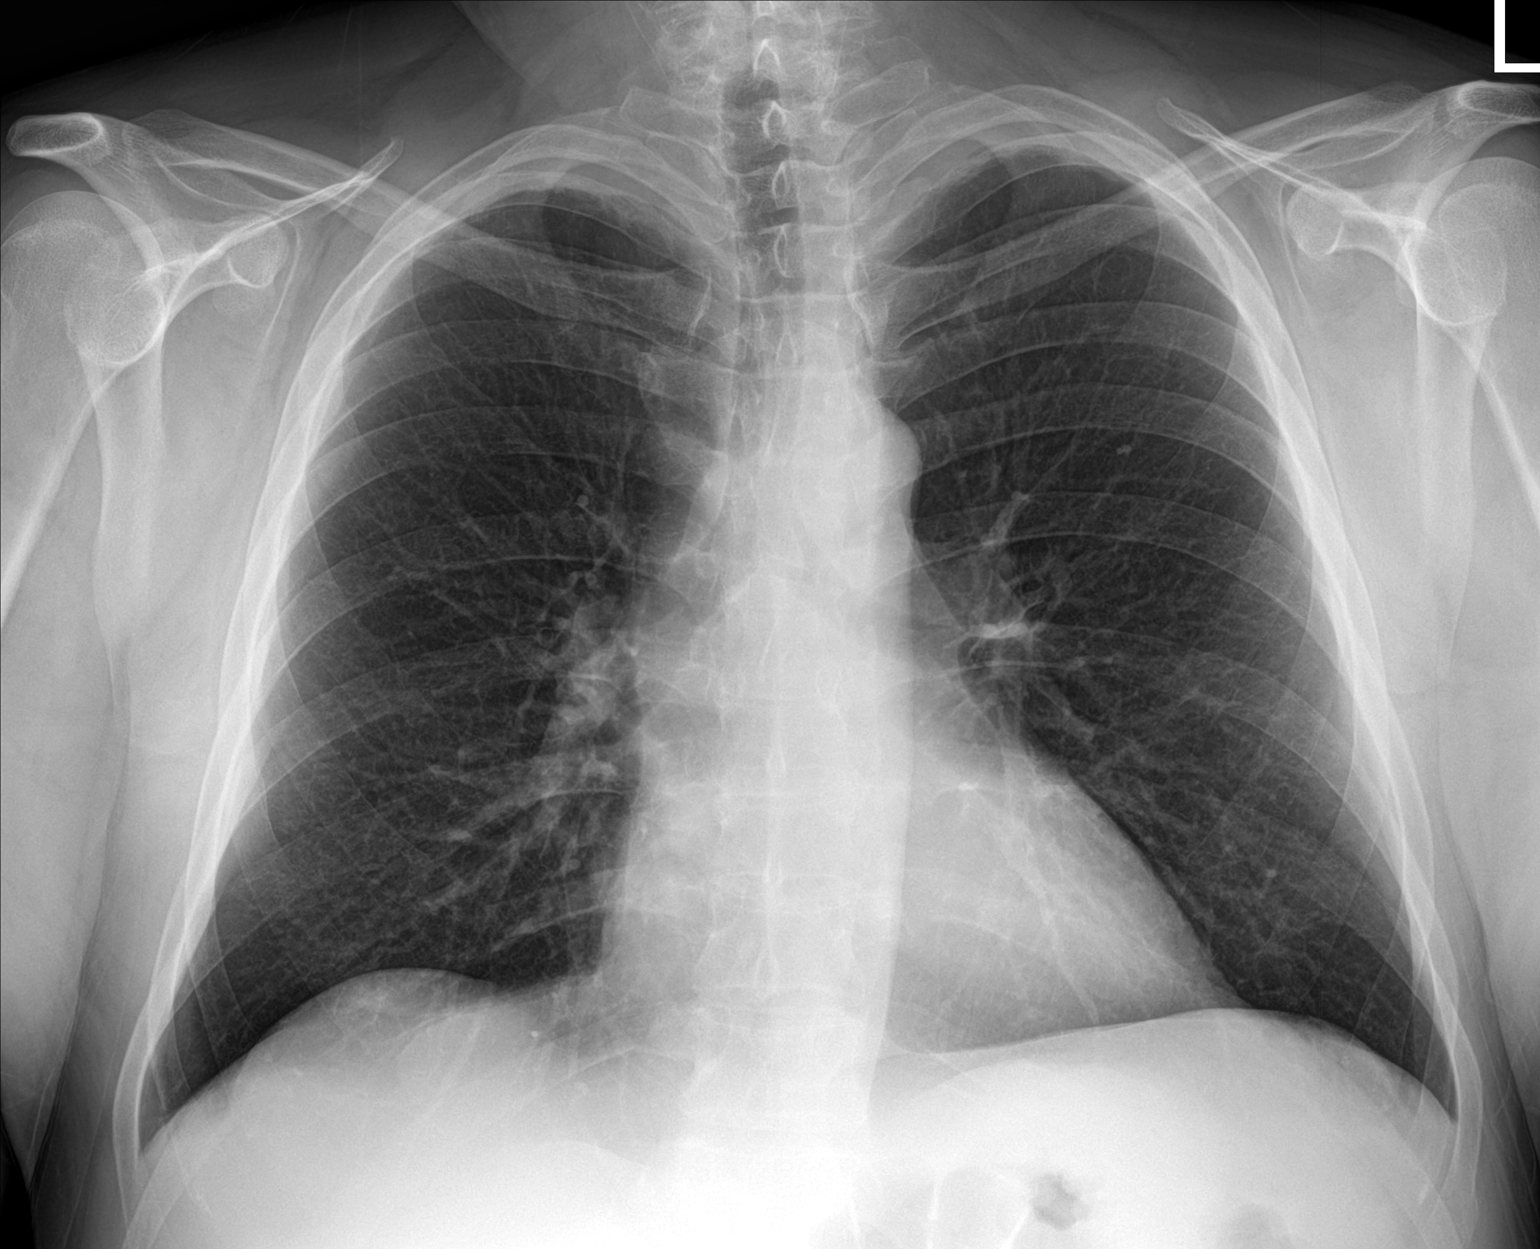

[chest lat (1 of 2)]
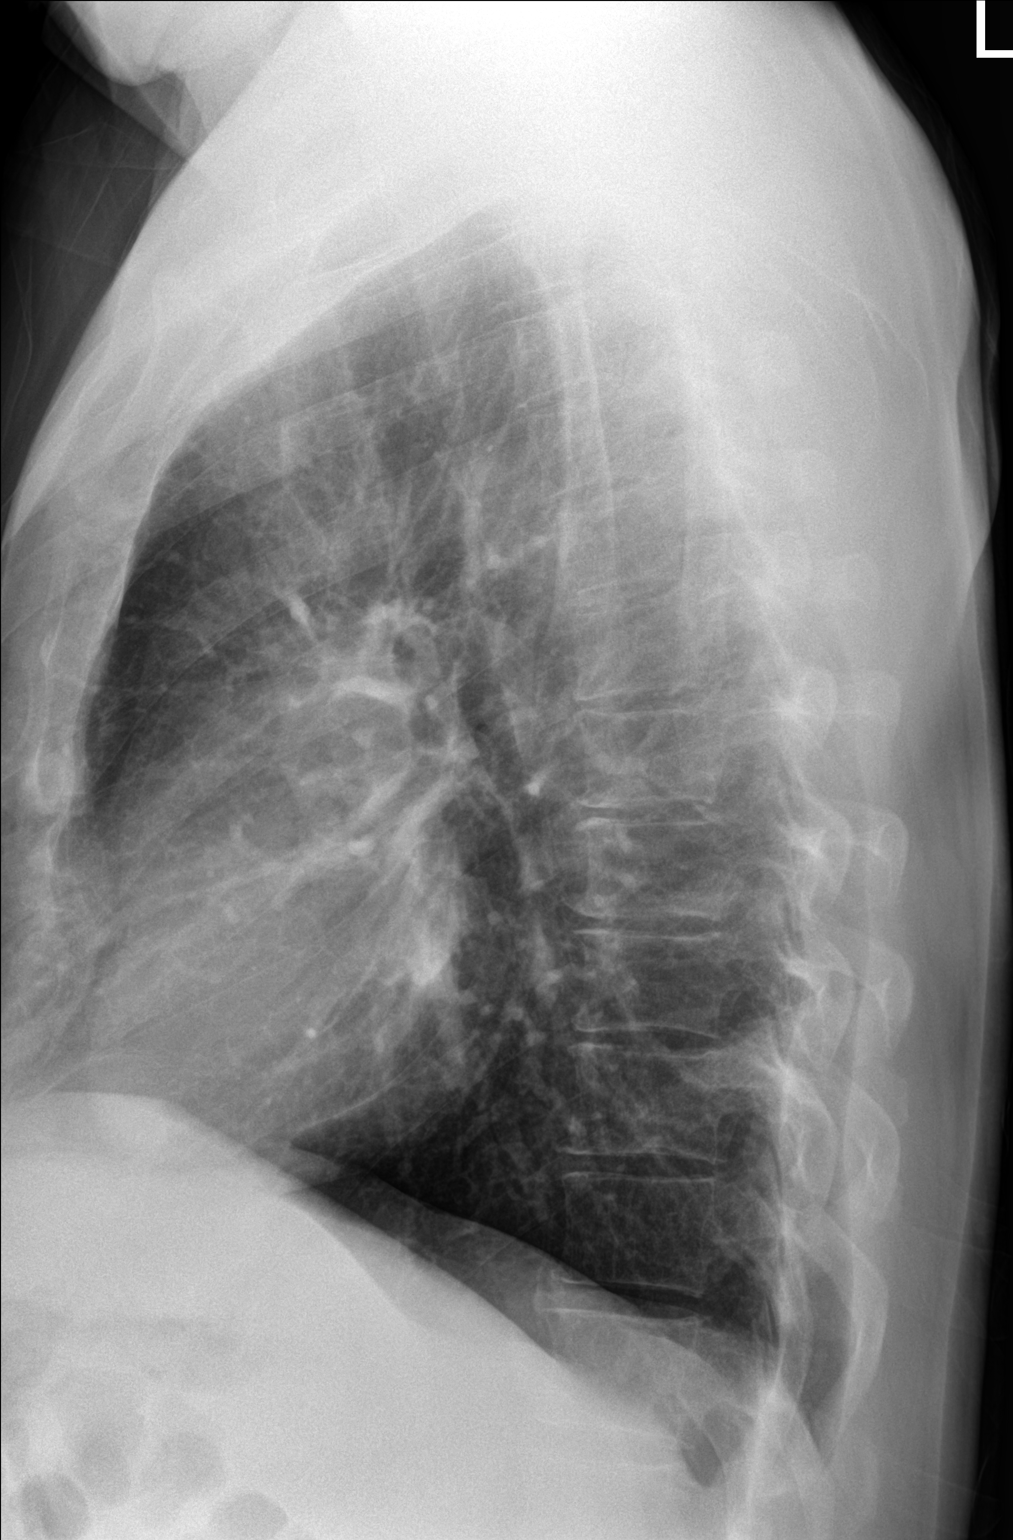

[chest lat (2 of 2)]
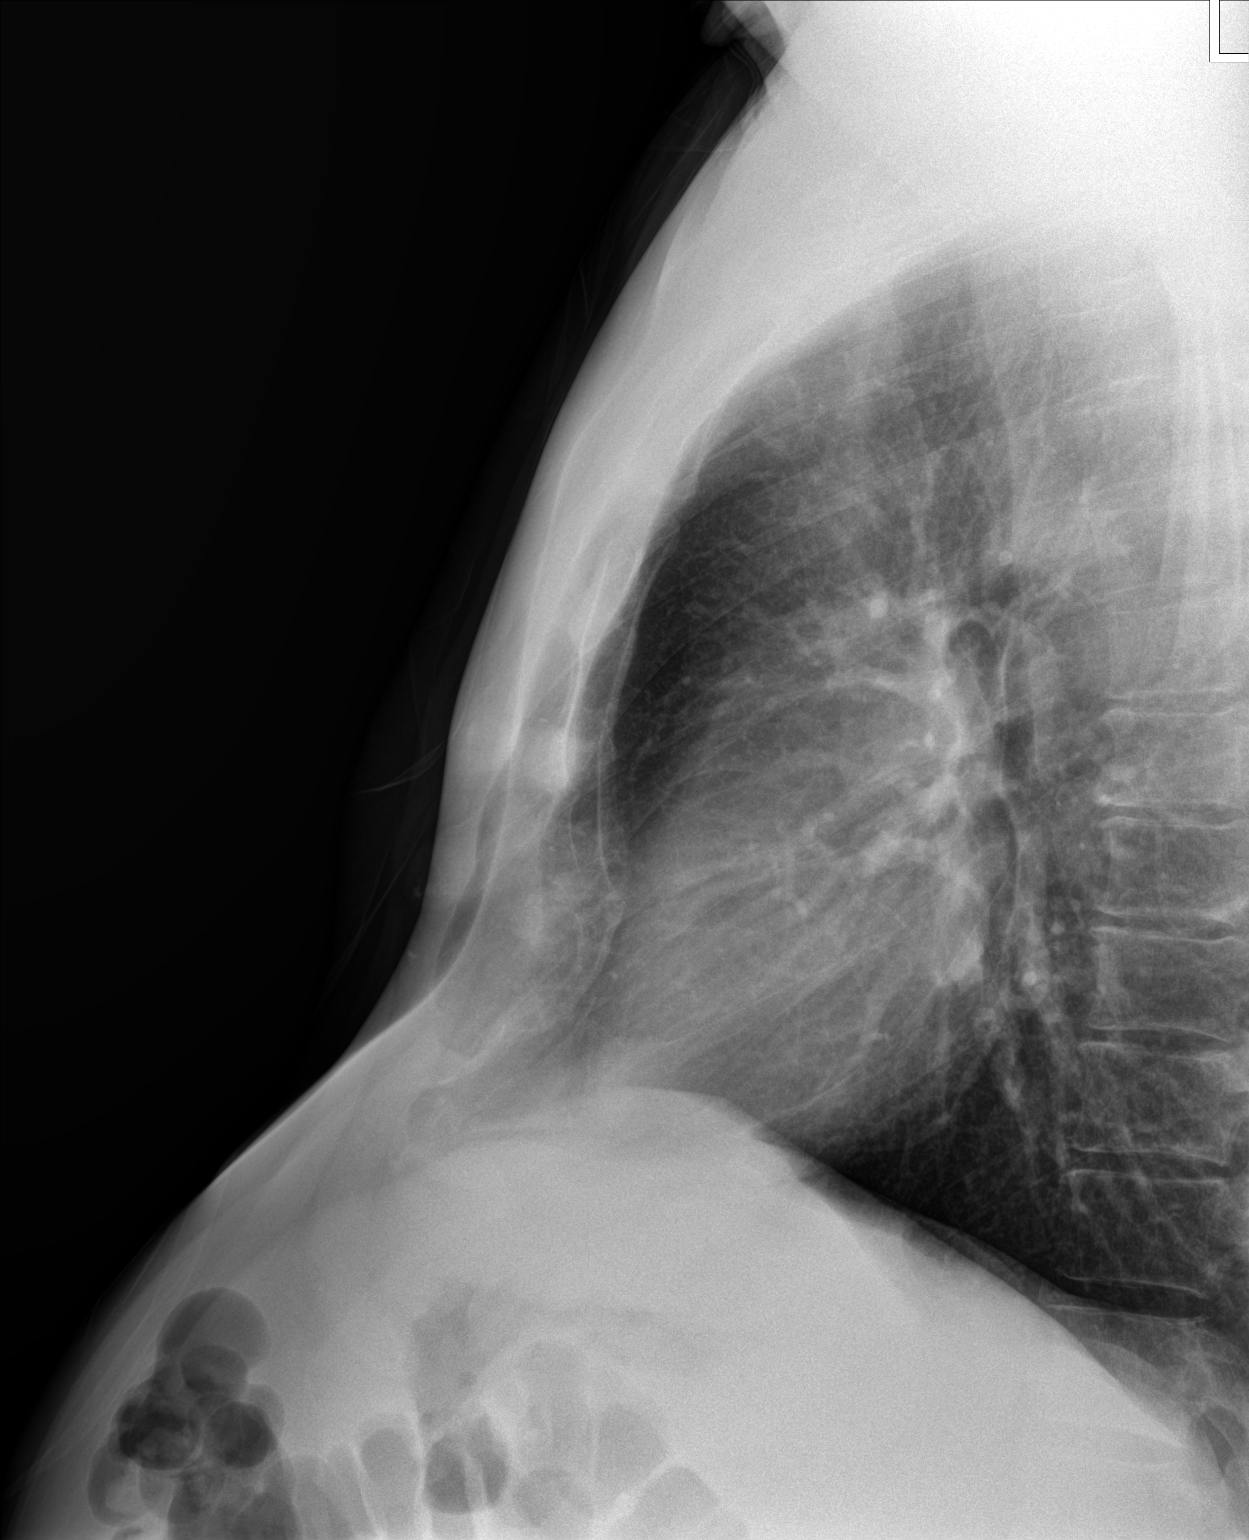

[3 of 3 positions shown; findings below may reference images not displayed]

FINDINGS: A small dense nodule in the left upper to mid lung is stable and
benign, likely calcified. No other nodules. No masses or
infiltrates. The heart, hila, and mediastinum are normal. No acute
abnormalities are identified.
IMPRESSION: No active cardiopulmonary disease.

## 2023-11-24 ENCOUNTER — Ambulatory Visit: Admitting: Family Medicine

## 2023-11-24 ENCOUNTER — Telehealth: Payer: Self-pay

## 2023-11-24 ENCOUNTER — Encounter: Payer: Self-pay | Admitting: Family Medicine

## 2023-11-24 ENCOUNTER — Other Ambulatory Visit (HOSPITAL_COMMUNITY): Payer: Self-pay

## 2023-11-24 VITALS — BP 126/75 | HR 99 | Temp 98.9°F | Ht 68.0 in | Wt 211.4 lb

## 2023-11-24 DIAGNOSIS — M5136 Other intervertebral disc degeneration, lumbar region with discogenic back pain only: Secondary | ICD-10-CM

## 2023-11-24 DIAGNOSIS — F1090 Alcohol use, unspecified, uncomplicated: Secondary | ICD-10-CM | POA: Diagnosis not present

## 2023-11-24 DIAGNOSIS — D72829 Elevated white blood cell count, unspecified: Secondary | ICD-10-CM | POA: Diagnosis not present

## 2023-11-24 DIAGNOSIS — G4733 Obstructive sleep apnea (adult) (pediatric): Secondary | ICD-10-CM

## 2023-11-24 DIAGNOSIS — E66811 Obesity, class 1: Secondary | ICD-10-CM | POA: Diagnosis not present

## 2023-11-24 DIAGNOSIS — R5382 Chronic fatigue, unspecified: Secondary | ICD-10-CM

## 2023-11-24 DIAGNOSIS — Z6832 Body mass index (BMI) 32.0-32.9, adult: Secondary | ICD-10-CM

## 2023-11-24 DIAGNOSIS — E6609 Other obesity due to excess calories: Secondary | ICD-10-CM

## 2023-11-24 MED ORDER — ZEPBOUND 5 MG/0.5ML ~~LOC~~ SOAJ: 5.0000 mg | SUBCUTANEOUS | 0 refills | Status: DC

## 2023-11-24 MED ORDER — ZEPBOUND 2.5 MG/0.5ML ~~LOC~~ SOAJ: 2.5000 mg | SUBCUTANEOUS | 0 refills | Status: DC

## 2023-11-24 MED ORDER — ZEPBOUND 7.5 MG/0.5ML ~~LOC~~ SOAJ: 7.5000 mg | SUBCUTANEOUS | 0 refills | Status: DC

## 2023-11-24 MED ORDER — PREDNISONE 10 MG (21) PO TBPK: ORAL_TABLET | ORAL | 0 refills | Status: DC

## 2023-11-24 MED ORDER — METHOCARBAMOL 500 MG PO TABS: 500.0000 mg | ORAL_TABLET | Freq: Three times a day (TID) | ORAL | 1 refills | Status: AC | PRN

## 2023-11-24 NOTE — Progress Notes (Signed)
 Subjective: CC: Follow-up alcoholism. PCP: Jolinda Norene HERO, DO YEP:Alec Gutierrez is a 63 y.o. male presenting to clinic today for:  1.  Alcoholism/low back pain Patient reports he continues to drink the same.  Has not weaned at all.  Reports excessive fatigue.  He had quite a bit of labs run last visit and they were all within normal range.  He does admit that he has been having some increased low back pain over the last 3 weeks.  Has known degenerative disc disease and was treated by chiropractic medicine about 15 years ago with resolution.  The back pain is primarily in the low back radiating to bilateral buttocks but not down legs.  No weakness in the legs.  No sensory changes in legs.  No preceding injury.  Flexion makes the pain worse but it is constant.  Utilizing meloxicam  but this is not helping  2.  Obesity associated with severe obstructive sleep apnea Patient is compliant with his CPAP machine.  Again continues to have fatigue as above.  He has tried diet modification and increasing physical activity but has failed to lose weight.  Has never been on any other oral treatments  3.  Leukocytosis Noted to have elevation in white blood cell count back in February but after multiple attempts nobody could get in touch with him.  Letter was sent with results.  He denies any sick symptoms today but reports fatigue as above.  No unplanned weight loss.  No night sweats.  No change in exercise tolerance   ROS: Per HPI  No Known Allergies Past Medical History:  Diagnosis Date   Arthritis    GERD (gastroesophageal reflux disease)    History of BPH    Hyperlipidemia    Hypertension    Sleep apnea    uses CPAP     Current Outpatient Medications:    amLODipine  (NORVASC ) 5 MG tablet, Take 1 tablet (5 mg total) by mouth daily., Disp: 90 tablet, Rfl: 3   atorvastatin  (LIPITOR) 40 MG tablet, Take 1 tablet (40 mg total) by mouth daily., Disp: 90 tablet, Rfl: 3   buPROPion  (WELLBUTRIN   XL) 150 MG 24 hr tablet, Take 3 tablets (450 mg total) by mouth daily., Disp: 270 tablet, Rfl: 3   hydrochlorothiazide  (HYDRODIURIL ) 25 MG tablet, Take 1 tablet (25 mg total) by mouth daily., Disp: 90 tablet, Rfl: 3   meloxicam  (MOBIC ) 15 MG tablet, TAKE 1 TABLET BY MOUTH DAILY AS NEEDED FOR PAIN, Disp: 90 tablet, Rfl: 1   omeprazole  (PRILOSEC) 40 MG capsule, Take 1 capsule (40 mg total) by mouth daily., Disp: 90 capsule, Rfl: 3 Social History   Socioeconomic History   Marital status: Divorced    Spouse name: Not on file   Number of children: Not on file   Years of education: Not on file   Highest education level: Not on file  Occupational History   Not on file  Tobacco Use   Smoking status: Former    Current packs/day: 0.00    Types: Cigarettes    Quit date: 05/15/2015    Years since quitting: 8.5   Smokeless tobacco: Never  Vaping Use   Vaping status: Never Used  Substance and Sexual Activity   Alcohol use: Yes    Alcohol/week: 49.0 standard drinks of alcohol    Types: 49 Cans of beer per week   Drug use: No   Sexual activity: Not on file  Other Topics Concern   Not on file  Social History Narrative   Not on file   Social Drivers of Health   Financial Resource Strain: Not on file  Food Insecurity: Not on file  Transportation Needs: Not on file  Physical Activity: Not on file  Stress: Not on file  Social Connections: Not on file  Intimate Partner Violence: Not on file   Family History  Problem Relation Age of Onset   Cancer Mother 61       unsure pt thinks it was colon cancer   Colon cancer Mother    Heart disease Father    Stroke Father    Esophageal cancer Neg Hx    Rectal cancer Neg Hx    Stomach cancer Neg Hx    Colon polyps Neg Hx     Objective: Office vital signs reviewed. BP 126/75   Pulse 99   Temp 98.9 F (37.2 C)   Ht 5' 8 (1.727 m)   Wt 211 lb 6 oz (95.9 kg)   SpO2 96%   BMI 32.14 kg/m   Physical Examination:  General: Awake, alert,  well nourished, No acute distress HEENT: Sclera white.  Moist mucous membranes Cardio: regular rate and rhythm, S1S2 heard, no murmurs appreciated Pulm: clear to auscultation bilaterally, no wheezes, rhonchi or rales; normal work of breathing on room air GI: Obese, protuberant abdomen MSK: Ambulating independently but appears to be uncomfortable getting up from a seated position.  No midline tenderness to palpation to the lumbar spine.  No palpable abnormalities   11/2018 Home sleep study: This home sleep test demonstrates severe obstructive sleep apnea with a total AHI of 65.2/hour and O2 nadir of 73%. Given the patient's medical history and sleep related complaints, treatment with positive airway pressure (in the form of CPAP) is recommended. This will require a full night CPAP titration study for proper treatment settings, O2 monitoring and mask fitting. Based on the severity of the sleep disordered breathing an attended titration study is indicated. However, patient's insurance has denied an attended sleep study; therefore, the patient will be advised to proceed with an autoPAP titration/trial at home for now. Please note that untreated obstructive sleep apnea may carry additional perioperative morbidity. Patients with significant obstructive sleep apnea should receive perioperative PAP therapy and the surgeons and particularly the anesthesiologist should be informed of the diagnosis and the severity of the sleep disordered breathing. The patient should be cautioned not to drive, work at heights, or operate dangerous or heavy equipment when tired or sleepy. Review and reiteration of good sleep hygiene measures should be pursued with any patient. Other causes of the patient's symptoms, including circadian rhythm disturbances, an underlying mood disorder, medication effect and/or an underlying medical problem cannot be ruled out based on this test. Clinical correlation is recommended. The patient and his  referring provider will be notified of the test results. The patient will be seen in follow up in sleep clinic at [facility].   I certify that I have reviewed the raw data recording prior to the issuance of this report in accordance with the standards of the American Academy of Sleep Medicine (AASM).   True Mar, MD, PhD  Assessment/ Plan: 63 y.o. male   Class 1 obesity due to excess calories with serious comorbidity and body mass index (BMI) of 32.0 to 32.9 in adult - Plan: tirzepatide  (ZEPBOUND ) 2.5 MG/0.5ML Pen, tirzepatide  (ZEPBOUND ) 5 MG/0.5ML Pen, tirzepatide  (ZEPBOUND ) 7.5 MG/0.5ML Pen  Severe obstructive sleep apnea - Plan: tirzepatide  (ZEPBOUND ) 2.5 MG/0.5ML Pen, tirzepatide  (ZEPBOUND ) 5  MG/0.5ML Pen, tirzepatide  (ZEPBOUND ) 7.5 MG/0.5ML Pen  Alcohol use disorder - Plan: VITAMIN D  25 Hydroxy (Vit-D Deficiency, Fractures)  Leukocytosis, unspecified type - Plan: CBC with Differential  Chronic fatigue - Plan: VITAMIN D  25 Hydroxy (Vit-D Deficiency, Fractures)  Degeneration of intervertebral disc of lumbar region with discogenic back pain - Plan: predniSONE  (STERAPRED UNI-PAK 21 TAB) 10 MG (21) TBPK tablet, methocarbamol  (ROBAXIN ) 500 MG tablet  I counseled him on risk versus benefit of GIP and GLP treatment.  He has known severe obstructive sleep apnea and is on CPAP for this, AHI >65. Body mass index is 32.14 kg/m. He is willing to pay out-of-pocket for this if insurance does not approve.  Pen was demonstrated to the patient today  Check vitamin D , CBC given chronic fatigue.  He also had some mild leukocytosis on last lab drawl.  Not having any other concerning symptoms.  If persistently elevated, will plan for hematology referral  Pred pack and Robaxin  ordered for back pain.  He will contact me if symptoms or not improving.  I offered him referral to physical therapy if he wanted to hold off on this as he wants to see his chiropractor first.  Will plan for referral to neurosurgery if  symptoms are progressive and/or nonresolving   Brewster Wolters CHRISTELLA Fielding, DO [facility] [phone]

## 2023-11-24 NOTE — Telephone Encounter (Signed)
 Pharmacy Patient Advocate Encounter   Received notification from CoverMyMeds that prior authorization for Zepbound  5 is required/requested.   Insurance verification completed.   The patient is insured through [facility] .   Per test claim: PA required; PA submitted to above mentioned insurance via CoverMyMeds Key/confirmation #/EOC [id number] Status is pending

## 2023-11-25 ENCOUNTER — Ambulatory Visit: Payer: Self-pay | Admitting: Family Medicine

## 2023-11-25 ENCOUNTER — Telehealth: Payer: Self-pay | Admitting: Family Medicine

## 2023-11-25 DIAGNOSIS — D72829 Elevated white blood cell count, unspecified: Secondary | ICD-10-CM

## 2023-11-25 LAB — CBC WITH DIFFERENTIAL/PLATELET
Basophils Absolute: 0 x10E3/uL (ref 0.0–0.2)
Basos: 0 %
EOS (ABSOLUTE): 0.2 x10E3/uL (ref 0.0–0.4)
Eos: 2 %
Hematocrit: 42 % (ref 37.5–51.0)
Hemoglobin: 14.2 g/dL (ref 13.0–17.7)
Immature Grans (Abs): 0.1 x10E3/uL (ref 0.0–0.1)
Immature Granulocytes: 1 %
Lymphocytes Absolute: 2.3 x10E3/uL (ref 0.7–3.1)
Lymphs: 20 %
MCH: 29.7 pg (ref 26.6–33.0)
MCHC: 33.8 g/dL (ref 31.5–35.7)
MCV: 88 fL (ref 79–97)
Monocytes Absolute: 0.8 x10E3/uL (ref 0.1–0.9)
Monocytes: 7 %
Neutrophils Absolute: 7.8 x10E3/uL — ABNORMAL HIGH (ref 1.4–7.0)
Neutrophils: 70 %
Platelets: 313 x10E3/uL (ref 150–450)
RBC: 4.78 x10E6/uL (ref 4.14–5.80)
RDW: 12.6 % (ref 11.6–15.4)
WBC: 11.1 x10E3/uL — ABNORMAL HIGH (ref 3.4–10.8)

## 2023-11-25 LAB — VITAMIN D 25 HYDROXY (VIT D DEFICIENCY, FRACTURES): Vit D, 25-Hydroxy: 39.2 ng/mL (ref 30.0–100.0)

## 2023-11-25 NOTE — Telephone Encounter (Signed)
 Please inform patient that his insurance does not cover the medication

## 2023-11-25 NOTE — Telephone Encounter (Signed)
 Patient aware.

## 2023-11-25 NOTE — Telephone Encounter (Signed)
 Pharmacy Patient Advocate Encounter  Received notification from [employer] that Prior Authorization for Zepbound  5 has been DENIED.  Full denial letter will be uploaded to the media tab. See denial reason below.   PA #/Case ID/Reference #: [id number]

## 2023-11-25 NOTE — Telephone Encounter (Signed)
 Referral sent to: [facility] [address] [place], [place] [address] [phone]  MyChart Message sent to Patient with Specialty Office contact information.

## 2023-11-25 NOTE — Telephone Encounter (Unsigned)
 Copied from CRM #[id number]. Topic: Referral - Question >> Nov 25, 2023 11:42 AM DeAngela L wrote: Reason for CRM: patients insurance company gave him a few hematologists that should be in network also closer to home for the patient   Lacie burton [address] [place] [address]  [place] [phone]  Olam Ned in [place]   Dary sherill in [place]   Pt num [phone] BENNIE)

## 2023-12-11 ENCOUNTER — Telehealth: Payer: Self-pay | Admitting: Family Medicine

## 2023-12-11 NOTE — Telephone Encounter (Signed)
 Copied from CRM #[id number]. Topic: General - Other >> Dec 11, 2023 11:02 AM Myrick T wrote: Reason for CRM: patient called stated since taking the medication prescribed he feels better although the pain is still there. He is requesting a call back from nurse about the Zepbound  for weight loss. Please f/u with patient

## 2023-12-14 NOTE — Telephone Encounter (Signed)
 Left message to call back.

## 2023-12-16 ENCOUNTER — Ambulatory Visit: Payer: Managed Care, Other (non HMO) | Admitting: Family Medicine

## 2023-12-22 ENCOUNTER — Ambulatory Visit: Admitting: Family Medicine

## 2023-12-22 ENCOUNTER — Encounter: Payer: Self-pay | Admitting: Family Medicine

## 2023-12-22 VITALS — BP 166/88 | HR 110 | Temp 97.7°F | Ht 68.0 in | Wt 211.1 lb

## 2023-12-22 DIAGNOSIS — G4733 Obstructive sleep apnea (adult) (pediatric): Secondary | ICD-10-CM | POA: Diagnosis not present

## 2023-12-22 DIAGNOSIS — I10 Essential (primary) hypertension: Secondary | ICD-10-CM

## 2023-12-22 DIAGNOSIS — Z23 Encounter for immunization: Secondary | ICD-10-CM | POA: Diagnosis not present

## 2023-12-22 DIAGNOSIS — E66811 Obesity, class 1: Secondary | ICD-10-CM

## 2023-12-22 DIAGNOSIS — Z6832 Body mass index (BMI) 32.0-32.9, adult: Secondary | ICD-10-CM

## 2023-12-22 DIAGNOSIS — K219 Gastro-esophageal reflux disease without esophagitis: Secondary | ICD-10-CM | POA: Diagnosis not present

## 2023-12-22 MED ORDER — ZEPBOUND 2.5 MG/0.5ML ~~LOC~~ SOLN: 2.5000 mg | SUBCUTANEOUS | 99 refills | Status: DC

## 2023-12-22 MED ORDER — ZEPBOUND 7.5 MG/0.5ML ~~LOC~~ SOLN: 7.5000 mg | SUBCUTANEOUS | 99 refills | Status: DC

## 2023-12-22 MED ORDER — ZEPBOUND 5 MG/0.5ML ~~LOC~~ SOLN: 5.0000 mg | SUBCUTANEOUS | 99 refills | Status: DC

## 2023-12-22 NOTE — Progress Notes (Signed)
 Subjective: RR:nazdpub with OSA PCP: Jolinda Norene HERO, DO YEP:Alec Gutierrez is a 63 y.o. male presenting to clinic today for:  Discussed the use of AI scribe software for clinical note transcription with the patient, who gave verbal consent to proceed.  History of Present Illness   Alec Gutierrez is a 63 year old male who presents for medication management after insurance rejection of Zepbound .  He plans to self-pay for Zepbound , which costs $499 per month, with a potential first-month discount to $299 for the low dose of 2.5 mg. The medication will be delivered with necessary supplies.  He is scheduled to see a hematologist and is considering whether to wait for this appointment before starting the medication. He prefers email communication for setting up payment details.  He plans to start with a low dose of 2.5 mg and gradually increase to 5 mg and then 7.5 mg, depending on his response.  He has a history of acid reflux, which he attributes to dietary habits. He currently takes medication for heartburn.  He is concerned about administering the injections himself and is considering having a nurse assist with the injections.      No known personal or family hx pancreatitis/ MEN2/ Medullary thyroid  cancer  ROS: Per HPI  No Known Allergies Past Medical History:  Diagnosis Date   Arthritis    GERD (gastroesophageal reflux disease)    History of BPH    Hyperlipidemia    Hypertension    Sleep apnea    uses CPAP     Current Outpatient Medications:    amLODipine  (NORVASC ) 5 MG tablet, Take 1 tablet (5 mg total) by mouth daily., Disp: 90 tablet, Rfl: 3   atorvastatin  (LIPITOR) 40 MG tablet, Take 1 tablet (40 mg total) by mouth daily., Disp: 90 tablet, Rfl: 3   buPROPion  (WELLBUTRIN  XL) 150 MG 24 hr tablet, Take 3 tablets (450 mg total) by mouth daily., Disp: 270 tablet, Rfl: 3   hydrochlorothiazide  (HYDRODIURIL ) 25 MG tablet, Take 1 tablet (25 mg total) by mouth daily.,  Disp: 90 tablet, Rfl: 3   meloxicam  (MOBIC ) 15 MG tablet, TAKE 1 TABLET BY MOUTH DAILY AS NEEDED FOR PAIN, Disp: 90 tablet, Rfl: 1   methocarbamol  (ROBAXIN ) 500 MG tablet, Take 1 tablet (500 mg total) by mouth every 8 (eight) hours as needed for muscle spasms., Disp: 30 tablet, Rfl: 1   omeprazole  (PRILOSEC) 40 MG capsule, Take 1 capsule (40 mg total) by mouth daily., Disp: 90 capsule, Rfl: 3   tirzepatide  (ZEPBOUND ) 2.5 MG/0.5ML injection vial, Inject 2.5 mg into the skin every 7 (seven) days. Month #1, advance dose as tolerated, Disp: 2 mL, Rfl: PRN   tirzepatide  (ZEPBOUND ) 5 MG/0.5ML injection vial, Inject 5 mg into the skin every 7 (seven) days. Month #2, advance dose as tolerated, Disp: 2 mL, Rfl: PRN   tirzepatide  (ZEPBOUND ) 7.5 MG/0.5ML injection vial, Inject 7.5 mg into the skin every 7 (seven) days. Month #3, advance dose as tolerated, Disp: 2 mL, Rfl: PRN   predniSONE  (STERAPRED UNI-PAK 21 TAB) 10 MG (21) TBPK tablet, As directed x 6 days (Patient not taking: Reported on 12/22/2023), Disp: 21 tablet, Rfl: 0 Social History   Socioeconomic History   Marital status: Divorced    Spouse name: Not on file   Number of children: Not on file   Years of education: Not on file   Highest education level: Not on file  Occupational History   Not on file  Tobacco Use  Smoking status: Former    Current packs/day: 0.00    Types: Cigarettes    Quit date: 05/15/2015    Years since quitting: 8.6   Smokeless tobacco: Never  Vaping Use   Vaping status: Never Used  Substance and Sexual Activity   Alcohol use: Yes    Alcohol/week: 49.0 standard drinks of alcohol    Types: 49 Cans of beer per week   Drug use: No   Sexual activity: Not on file  Other Topics Concern   Not on file  Social History Narrative   Not on file   Social Drivers of Health   Financial Resource Strain: Not on file  Food Insecurity: Not on file  Transportation Needs: Not on file  Physical Activity: Not on file  Stress:  Not on file  Social Connections: Not on file  Intimate Partner Violence: Not on file   Family History  Problem Relation Age of Onset   Cancer Mother 54       unsure pt thinks it was colon cancer   Colon cancer Mother    Heart disease Father    Stroke Father    Esophageal cancer Neg Hx    Rectal cancer Neg Hx    Stomach cancer Neg Hx    Colon polyps Neg Hx     Objective: Office vital signs reviewed. BP (!) 166/88   Pulse (!) 110   Temp 97.7 F (36.5 C)   Ht 5' 8 (1.727 m)   Wt 211 lb 2 oz (95.8 kg)   SpO2 97%   BMI 32.10 kg/m   Physical Examination:  General: Awake, alert, obese, No acute distress HEENT: sclera white, MMM Cardio: regular rate and rhythm, S1S2 heard, no murmurs appreciated Pulm: clear to auscultation bilaterally, no wheezes, rhonchi or rales; normal work of breathing on room air  Assessment/ Plan: 63 y.o. male   Class 1 obesity due to excess calories with serious comorbidity and body mass index (BMI) of 32.0 to 32.9 in adult - Plan: tirzepatide  (ZEPBOUND ) 2.5 MG/0.5ML injection vial, tirzepatide  (ZEPBOUND ) 5 MG/0.5ML injection vial, tirzepatide  (ZEPBOUND ) 7.5 MG/0.5ML injection vial  Severe obstructive sleep apnea - Plan: tirzepatide  (ZEPBOUND ) 2.5 MG/0.5ML injection vial, tirzepatide  (ZEPBOUND ) 5 MG/0.5ML injection vial, tirzepatide  (ZEPBOUND ) 7.5 MG/0.5ML injection vial  Encounter for immunization - Plan: Flu vaccine trivalent PF, 6mos and older(Flulaval,Afluria,Fluarix,Fluzone)  Gastroesophageal reflux disease without esophagitis  Primary hypertension  Assessment and Plan    Obesity, class 1 w/ OSA and HTN Obesity management discussed. Insurance denied coverage; self-pay chosen. Initial 2.5 mg dose recommended to minimize side effects. Gradual dose increase planned based on tolerance and weight loss. Emphasized dietary changes to enhance efficacy and minimize side effects. Discussed potential side effects including nausea, abdominal pain, and acid  reflux, especially with high-fat foods. - Send prescription for Zepbound  to [facility] for self-pay. - Start with 2.5 mg dose of Zepbound  for the first month. - Increase dose to 5 mg in the second month and 7.5 mg in the third month, based on tolerance and weight loss. - Provide dietary guidance to avoid high-fat and high-carbohydrate foods to minimize side effects. - Send prescription for antiemetic to pharmacy as a precaution.  Gastroesophageal reflux disease (GERD) GERD management discussed with emphasis on dietary changes to support weight loss and minimize symptoms. Advised to avoid high-fat foods to reduce heartburn and other GERD symptoms. - Advise dietary modifications to reduce high-fat and high-carbohydrate intake to manage GERD symptoms.      Follow up  for BP recheck. Not at goal today. Flu shot administered  [name], DO [facility] [phone]

## 2023-12-22 NOTE — Patient Instructions (Signed)
Tips for success with Zepbound (and by success, how not to be super sick on your stomach): Eat small meals AVOID heavy foods (fried/ high in carbs like bread, pasta, rice) AVOID carbonated beverages (soda/ beer, as these can increase bloating) DOUBLE your water intake (will help you avoid constipation/ dehydration)  Zepbound CAN cause: Nausea Abdominal pain Increased acid reflux (sometimes presents as "sour burps") Constipation OR Diarrhea Fatigue (especially when you first start it)

## 2023-12-25 ENCOUNTER — Ambulatory Visit: Admitting: Family Medicine

## 2023-12-28 ENCOUNTER — Telehealth: Payer: Self-pay | Admitting: Nurse Practitioner

## 2023-12-28 ENCOUNTER — Telehealth: Payer: Self-pay

## 2023-12-28 NOTE — Telephone Encounter (Signed)
CALLED AND LEFT MESSAGE TO CALL BACK

## 2023-12-28 NOTE — Telephone Encounter (Signed)
 Copied from CRM [id number]. Topic: Clinical - Medical Advice >> Dec 28, 2023  9:07 AM Merlynn LABOR wrote: Reason for CRM: Patient called in requesting diet information for Zepbound  medication for what he can eat and cannot eat. Please contact patient to advise. Patient can be reached at [phone].

## 2023-12-28 NOTE — Telephone Encounter (Signed)
 There are no specific foods but this is my general recommendation, which is typically outlined on the AVS  Tips for success with Zepbound  (and by success, how not to be super sick on your stomach): Eat small meals AVOID heavy foods (fried/ high in carbs like bread, pasta, rice) AVOID carbonated beverages (soda/ beer, as these can increase bloating) DOUBLE your water intake (will help you avoid constipation/ dehydration) Increase protein intake (meat, yogurt, tofu, beans)  Zepbound  CAN cause: Nausea Abdominal pain Increased acid reflux (sometimes presents as sour burps) Constipation OR Diarrhea Fatigue (especially when you first start it)

## 2023-12-28 NOTE — Telephone Encounter (Signed)
 Pt called with questions about upcoming appt.

## 2023-12-29 NOTE — Progress Notes (Unsigned)
 [facility]   [phone] [phone]   Clinic New consult Note   Patient Care Team: Jolinda Norene HERO, DO as PCP - General (Family Medicine) 12/29/2023  CHIEF COMPLAINTS/PURPOSE OF CONSULTATION:  Leukocytosis, referred by PCP Dr. Rosina Jolinda   HISTORY OF PRESENTING ILLNESS:  Alec Gutierrez 63 y.o. male is here because of ***  ***He was found to have abnormal CBC from *** ***He denies recent chest pain on exertion, shortness of breath on minimal exertion, pre-syncopal episodes, or palpitations. ***He had not noticed any recent bleeding such as epistaxis, hematuria or hematochezia ***The patient denies over the counter NSAID ingestion. He is not *** on antiplatelets agents. His last colonoscopy was *** ***He had no prior history or diagnosis of cancer. His age appropriate screening programs are up-to-date. ***He denies any pica and eats a variety of diet. ***He never donated blood or received blood transfusion ***The patient was prescribed oral iron supplements and he takes ***  MEDICAL HISTORY:  Past Medical History:  Diagnosis Date   Arthritis    GERD (gastroesophageal reflux disease)    History of BPH    Hyperlipidemia    Hypertension    Sleep apnea    uses CPAP     SURGICAL HISTORY: Past Surgical History:  Procedure Laterality Date   COLONOSCOPY  02/09/2008   EYE SURGERY     HERNIA REPAIR     inguinal and abd    SOCIAL HISTORY: Social History   Socioeconomic History   Marital status: Divorced    Spouse name: Not on file   Number of children: Not on file   Years of education: Not on file   Highest education level: Not on file  Occupational History   Not on file  Tobacco Use   Smoking status: Former    Current packs/day: 0.00    Types: Cigarettes    Quit date: 05/15/2015    Years since quitting: 8.6   Smokeless tobacco: Never  Vaping Use   Vaping status: Never Used  Substance and Sexual Activity   Alcohol use:  Yes    Alcohol/week: 49.0 standard drinks of alcohol    Types: 49 Cans of beer per week   Drug use: No   Sexual activity: Not on file  Other Topics Concern   Not on file  Social History Narrative   Not on file   Social Drivers of Health   Financial Resource Strain: Not on file  Food Insecurity: Not on file  Transportation Needs: Not on file  Physical Activity: Not on file  Stress: Not on file  Social Connections: Not on file  Intimate Partner Violence: Not on file    FAMILY HISTORY: Family History  Problem Relation Age of Onset   Cancer Mother 92       unsure pt thinks it was colon cancer   Colon cancer Mother    Heart disease Father    Stroke Father    Esophageal cancer Neg Hx    Rectal cancer Neg Hx    Stomach cancer Neg Hx    Colon polyps Neg Hx     ALLERGIES:  has no known allergies.  MEDICATIONS:  Current Outpatient Medications  Medication Sig Dispense Refill   amLODipine  (NORVASC ) 5 MG tablet Take 1 tablet (5 mg total) by mouth daily. 90 tablet 3   atorvastatin  (LIPITOR) 40 MG tablet Take 1 tablet (40 mg total) by mouth daily. 90 tablet 3   buPROPion  (WELLBUTRIN  XL) 150 MG  24 hr tablet Take 3 tablets (450 mg total) by mouth daily. 270 tablet 3   hydrochlorothiazide  (HYDRODIURIL ) 25 MG tablet Take 1 tablet (25 mg total) by mouth daily. 90 tablet 3   meloxicam  (MOBIC ) 15 MG tablet TAKE 1 TABLET BY MOUTH DAILY AS NEEDED FOR PAIN 90 tablet 1   methocarbamol  (ROBAXIN ) 500 MG tablet Take 1 tablet (500 mg total) by mouth every 8 (eight) hours as needed for muscle spasms. 30 tablet 1   omeprazole  (PRILOSEC) 40 MG capsule Take 1 capsule (40 mg total) by mouth daily. 90 capsule 3   predniSONE  (STERAPRED UNI-PAK 21 TAB) 10 MG (21) TBPK tablet As directed x 6 days (Patient not taking: Reported on 12/22/2023) 21 tablet 0   tirzepatide  (ZEPBOUND ) 2.5 MG/0.5ML injection vial Inject 2.5 mg into the skin every 7 (seven) days. Month #1, advance dose as tolerated 2 mL PRN    tirzepatide  (ZEPBOUND ) 5 MG/0.5ML injection vial Inject 5 mg into the skin every 7 (seven) days. Month #2, advance dose as tolerated 2 mL PRN   tirzepatide  (ZEPBOUND ) 7.5 MG/0.5ML injection vial Inject 7.5 mg into the skin every 7 (seven) days. Month #3, advance dose as tolerated 2 mL PRN   No current facility-administered medications for this visit.    REVIEW OF SYSTEMS:   Constitutional: Denies fevers, chills or abnormal night sweats Eyes: Denies blurriness of vision, double vision or watery eyes Ears, nose, mouth, throat, and face: Denies mucositis or sore throat Respiratory: Denies cough, dyspnea or wheezes Cardiovascular: Denies palpitation, chest discomfort or lower extremity swelling Gastrointestinal:  Denies nausea, heartburn or change in bowel habits Skin: Denies abnormal skin rashes Lymphatics: Denies new lymphadenopathy or easy bruising Neurological:Denies numbness, tingling or new weaknesses Behavioral/Psych: Mood is stable, no new changes  All other systems were reviewed with the patient and are negative.  PHYSICAL EXAMINATION: ECOG PERFORMANCE STATUS: {CHL ONC ECOG PS:[id number]}  There were no vitals filed for this visit. There were no vitals filed for this visit.  GENERAL:alert, no distress and comfortable SKIN: skin color, texture, turgor are normal, no rashes or significant lesions EYES: normal, conjunctiva are pink and non-injected, sclera clear OROPHARYNX:no exudate, no erythema and lips, buccal mucosa, and tongue normal  NECK: supple, thyroid  normal size, non-tender, without nodularity LYMPH:  no palpable lymphadenopathy in the cervical, axillary or inguinal LUNGS: clear to auscultation and percussion with normal breathing effort HEART: regular rate & rhythm and no murmurs and no lower extremity edema ABDOMEN:abdomen soft, non-tender and normal bowel sounds Musculoskeletal:no cyanosis of digits and no clubbing  PSYCH: alert & oriented x 3 with fluent  speech NEURO: no focal motor/sensory deficits  LABORATORY DATA:  I have reviewed the data as listed    Latest Ref Rng & Units 11/24/2023    8:32 AM 06/18/2023    8:40 AM 09/03/2021    8:46 AM  CBC  WBC 3.4 - 10.8 x10E3/uL 11.1  11.3  9.6   Hemoglobin 13.0 - 17.7 g/dL 85.7  86.4  86.1   Hematocrit 37.5 - 51.0 % 42.0  41.0  40.3   Platelets 150 - 450 x10E3/uL 313  351  284        Latest Ref Rng & Units 06/18/2023    8:40 AM 09/03/2021    8:46 AM 11/30/2020    8:29 AM  CMP  Glucose 70 - 99 mg/dL 893  895  891   BUN 8 - 27 mg/dL 19  16  13    Creatinine 0.76 -  1.27 mg/dL 8.94  8.96  9.01   Sodium 134 - 144 mmol/L 140  137  138   Potassium 3.5 - 5.2 mmol/L 3.8  4.0  3.8   Chloride 96 - 106 mmol/L 102  100  99   CO2 20 - 29 mmol/L 21  21  25    Calcium  8.6 - 10.2 mg/dL 9.2  9.1  9.2   Total Protein 6.0 - 8.5 g/dL 6.4  6.5  6.1   Total Bilirubin 0.0 - 1.2 mg/dL 0.2  0.4  0.5   Alkaline Phos 44 - 121 IU/L 77  65  60   AST 0 - 40 IU/L 19  19  16    ALT 0 - 44 IU/L 29  22  21       RADIOGRAPHIC STUDIES: I have personally reviewed the radiological images as listed and agreed with the findings in the report. No results found.  ASSESSMENT & PLAN:  No problem-specific Assessment & Plan notes found for this encounter.    All questions were answered. The patient knows to call the clinic with any problems, questions or concerns. I spent {CHL ONC TIME VISIT - DTPQU:[id number]} counseling the patient face to face. The total time spent in the appointment was {CHL ONC TIME VISIT - DTPQU:[id number]} and more than 50% was on counseling.     Wilman Tucker K Benedict Kue, NP 12/29/23 12:37 PM

## 2023-12-29 NOTE — Telephone Encounter (Signed)
 PT R/C

## 2023-12-30 ENCOUNTER — Other Ambulatory Visit: Payer: Self-pay | Admitting: *Deleted

## 2023-12-30 DIAGNOSIS — D649 Anemia, unspecified: Secondary | ICD-10-CM

## 2023-12-30 NOTE — Telephone Encounter (Signed)
 I called and spoke with patient and made him aware of PCP recommendations. Patient voiced understanding. He is going to come in Friday morning and have nurse show him how to inject Zepbound .

## 2023-12-31 ENCOUNTER — Inpatient Hospital Stay

## 2023-12-31 ENCOUNTER — Inpatient Hospital Stay: Attending: Nurse Practitioner | Admitting: Nurse Practitioner

## 2023-12-31 ENCOUNTER — Encounter: Payer: Self-pay | Admitting: Nurse Practitioner

## 2023-12-31 VITALS — BP 147/91 | HR 100 | Temp 98.2°F | Resp 18 | Wt 210.9 lb

## 2023-12-31 DIAGNOSIS — D72829 Elevated white blood cell count, unspecified: Secondary | ICD-10-CM | POA: Insufficient documentation

## 2023-12-31 DIAGNOSIS — I10 Essential (primary) hypertension: Secondary | ICD-10-CM | POA: Insufficient documentation

## 2023-12-31 DIAGNOSIS — Z87891 Personal history of nicotine dependence: Secondary | ICD-10-CM | POA: Diagnosis not present

## 2023-12-31 DIAGNOSIS — G8929 Other chronic pain: Secondary | ICD-10-CM

## 2023-12-31 DIAGNOSIS — N400 Enlarged prostate without lower urinary tract symptoms: Secondary | ICD-10-CM | POA: Insufficient documentation

## 2023-12-31 DIAGNOSIS — Z809 Family history of malignant neoplasm, unspecified: Secondary | ICD-10-CM

## 2023-12-31 DIAGNOSIS — M1990 Unspecified osteoarthritis, unspecified site: Secondary | ICD-10-CM

## 2023-12-31 DIAGNOSIS — Z808 Family history of malignant neoplasm of other organs or systems: Secondary | ICD-10-CM | POA: Diagnosis not present

## 2023-12-31 DIAGNOSIS — E538 Deficiency of other specified B group vitamins: Secondary | ICD-10-CM

## 2023-12-31 DIAGNOSIS — Z79899 Other long term (current) drug therapy: Secondary | ICD-10-CM | POA: Insufficient documentation

## 2023-12-31 DIAGNOSIS — F32A Depression, unspecified: Secondary | ICD-10-CM

## 2023-12-31 DIAGNOSIS — K219 Gastro-esophageal reflux disease without esophagitis: Secondary | ICD-10-CM | POA: Insufficient documentation

## 2023-12-31 DIAGNOSIS — M549 Dorsalgia, unspecified: Secondary | ICD-10-CM

## 2023-12-31 DIAGNOSIS — G4733 Obstructive sleep apnea (adult) (pediatric): Secondary | ICD-10-CM | POA: Insufficient documentation

## 2023-12-31 DIAGNOSIS — Z8249 Family history of ischemic heart disease and other diseases of the circulatory system: Secondary | ICD-10-CM

## 2023-12-31 DIAGNOSIS — Z800 Family history of malignant neoplasm of digestive organs: Secondary | ICD-10-CM | POA: Insufficient documentation

## 2023-12-31 DIAGNOSIS — Z7289 Other problems related to lifestyle: Secondary | ICD-10-CM

## 2023-12-31 DIAGNOSIS — Z823 Family history of stroke: Secondary | ICD-10-CM

## 2023-12-31 DIAGNOSIS — D649 Anemia, unspecified: Secondary | ICD-10-CM

## 2023-12-31 LAB — CMP (CANCER CENTER ONLY)
ALT: 35 U/L (ref 0–44)
AST: 24 U/L (ref 15–41)
Albumin: 4.6 g/dL (ref 3.5–5.0)
Alkaline Phosphatase: 80 U/L (ref 38–126)
Anion gap: 14 (ref 5–15)
BUN: 15 mg/dL (ref 8–23)
CO2: 23 mmol/L (ref 22–32)
Calcium: 9.5 mg/dL (ref 8.9–10.3)
Chloride: 103 mmol/L (ref 98–111)
Creatinine: 1.12 mg/dL (ref 0.61–1.24)
GFR, Estimated: >60 mL/min (ref >60)
Glucose, Bld: 101 mg/dL — ABNORMAL HIGH (ref 70–99)
Potassium: 3.7 mmol/L (ref 3.5–5.1)
Sodium: 140 mmol/L (ref 135–145)
Total Bilirubin: 0.4 mg/dL (ref 0.0–1.2)
Total Protein: 7.1 g/dL (ref 6.5–8.1)

## 2023-12-31 LAB — CBC WITH DIFFERENTIAL (CANCER CENTER ONLY)
Abs Immature Granulocytes: 0.04 K/uL (ref 0.00–0.07)
Basophils Absolute: 0 K/uL (ref 0.0–0.1)
Basophils Relative: 0 %
Eosinophils Absolute: 0.2 K/uL (ref 0.0–0.5)
Eosinophils Relative: 2 %
HCT: 40.2 % (ref 39.0–52.0)
Hemoglobin: 14 g/dL (ref 13.0–17.0)
Immature Granulocytes: 0 %
Lymphocytes Relative: 22 %
Lymphs Abs: 2.3 K/uL (ref 0.7–4.0)
MCH: 29.4 pg (ref 26.0–34.0)
MCHC: 34.8 g/dL (ref 30.0–36.0)
MCV: 84.5 fL (ref 80.0–100.0)
Monocytes Absolute: 0.9 K/uL (ref 0.1–1.0)
Monocytes Relative: 9 %
Neutro Abs: 7 K/uL (ref 1.7–7.7)
Neutrophils Relative %: 67 %
Platelet Count: 313 K/uL (ref 150–400)
RBC: 4.76 MIL/uL (ref 4.22–5.81)
RDW: 12.3 % (ref 11.5–15.5)
WBC Count: 10.4 K/uL (ref 4.0–10.5)
nRBC: 0 % (ref 0.0–0.2)

## 2023-12-31 LAB — SAVE SMEAR(SSMR), FOR PROVIDER SLIDE REVIEW

## 2024-01-01 ENCOUNTER — Ambulatory Visit (INDEPENDENT_AMBULATORY_CARE_PROVIDER_SITE_OTHER)

## 2024-01-01 NOTE — Progress Notes (Signed)
 Patient is in office today for a nurse visit for instructions on how to give himself a Zepbound  injection. Instructions given and demonstration provided to patient. Advised patient that if he had any questions about self administering his medication to give us  a call, we can schedule him for a nurse visit for further instructions.

## 2024-01-08 ENCOUNTER — Telehealth: Payer: Self-pay

## 2024-01-08 NOTE — Telephone Encounter (Signed)
 Copied from CRM [id number]. Topic: Clinical - Medical Advice >> Jan 08, 2024 11:01 AM Myrick T wrote: Reason for CRM: patient wants to make sure he will be fine as he gave himself the injection of tirzepatide  (ZEPBOUND ) 7.5 MG/0.5ML injection vial and then rubbed the spot. He said the instruction said not to rub the area after the injection. Please f/u with patient

## 2024-01-08 NOTE — Telephone Encounter (Signed)
 Called and spoke with patient and answered all questions. Aware to just watch the spot incase it got irritated. Patient verbalized understanding.

## 2024-01-15 ENCOUNTER — Telehealth: Payer: Self-pay | Admitting: Family Medicine

## 2024-01-15 DIAGNOSIS — G4733 Obstructive sleep apnea (adult) (pediatric): Secondary | ICD-10-CM

## 2024-01-15 DIAGNOSIS — E66811 Obesity, class 1: Secondary | ICD-10-CM

## 2024-01-15 MED ORDER — ZEPBOUND 2.5 MG/0.5ML ~~LOC~~ SOLN: 2.5000 mg | SUBCUTANEOUS | 99 refills | Status: DC

## 2024-01-15 NOTE — Telephone Encounter (Signed)
 I spoke to the pt and he would like to stay on the 2.5mg  injection for another month. Rx sent in and pt aware.

## 2024-01-15 NOTE — Telephone Encounter (Signed)
 Copied from CRM [id number]. Topic: Clinical - Medical Advice >> Jan 08, 2024 11:01 AM Myrick T wrote: Reason for CRM: patient wants to make sure he will be fine as he gave himself the injection of tirzepatide  (ZEPBOUND ) 7.5 MG/0.5ML injection vial and then rubbed the spot. He said the instruction said not to rub the area after the injection. Please f/u with patient >> Jan 15, 2024 12:36 PM Emylou G wrote: Patient adv.. he is okay to stay on tirzepatide  (ZEPBOUND ) 2.5 MG/0.5ML injection vial for a month more.SABRA if that is okay - Can you please put in a script asap he wants his discount because he is selfpay.. and [facility] ( same place [facility] always [facility] )

## 2024-01-18 ENCOUNTER — Other Ambulatory Visit: Payer: Self-pay | Admitting: *Deleted

## 2024-01-18 DIAGNOSIS — M19049 Primary osteoarthritis, unspecified hand: Secondary | ICD-10-CM

## 2024-01-18 MED ORDER — MELOXICAM 15 MG PO TABS: 15.0000 mg | ORAL_TABLET | Freq: Every day | ORAL | 1 refills | Status: AC | PRN

## 2024-03-14 ENCOUNTER — Telehealth: Payer: Self-pay

## 2024-03-14 DIAGNOSIS — E66811 Obesity, class 1: Secondary | ICD-10-CM

## 2024-03-14 DIAGNOSIS — G4733 Obstructive sleep apnea (adult) (pediatric): Secondary | ICD-10-CM

## 2024-03-14 MED ORDER — ZEPBOUND 10 MG/0.5ML ~~LOC~~ SOLN
10.0000 mg | SUBCUTANEOUS | 0 refills | Status: AC
Start: 2024-03-14 — End: ?

## 2024-03-14 NOTE — Telephone Encounter (Signed)
 Rx sent.  I request that he have a follow-up with me on December 2 after our last visit for weight check.  Please make sure that he has this appointment scheduled as I do not see any indication that he has a follow-up in place

## 2024-03-14 NOTE — Telephone Encounter (Signed)
 Left vm for pt to callback

## 2024-03-14 NOTE — Telephone Encounter (Signed)
 Copied from CRM #[id number]. Topic: Clinical - Prescription Issue >> Mar 14, 2024  8:27 AM Alec Gutierrez wrote: Reason for CRM: Patient is needing his tirzepatide  (ZEPBOUND ) 7.5 MG/0.5ML injection vial refilled but he is needing 10 MG's. Could you assist? He needs it filled no later than this afternoon or he will lose his discounts.   [facility] [place], [place] - [phone] [place] [address] Phone: [phone] Fax: [phone] Hours: Not open 24 hours

## 2024-03-14 NOTE — Addendum Note (Signed)
 Addended by: JOLINDA NORENE HERO on: 03/14/2024 09:29 AM   Modules accepted: Orders

## 2024-03-28 ENCOUNTER — Encounter: Payer: Self-pay | Admitting: Family Medicine

## 2024-03-28 ENCOUNTER — Ambulatory Visit: Admitting: Family Medicine

## 2024-03-28 VITALS — BP 127/74 | HR 83 | Temp 97.9°F | Ht 68.0 in | Wt 191.4 lb

## 2024-03-28 DIAGNOSIS — Z713 Dietary counseling and surveillance: Secondary | ICD-10-CM

## 2024-03-28 DIAGNOSIS — G4733 Obstructive sleep apnea (adult) (pediatric): Secondary | ICD-10-CM

## 2024-03-28 DIAGNOSIS — E663 Overweight: Secondary | ICD-10-CM

## 2024-03-28 MED ORDER — ZEPBOUND 15 MG/0.5ML ~~LOC~~ SOLN: 15.0000 mg | SUBCUTANEOUS | 4 refills | Status: AC

## 2024-03-28 MED ORDER — ZEPBOUND 12.5 MG/0.5ML ~~LOC~~ SOLN: 12.5000 mg | SUBCUTANEOUS | 0 refills | Status: AC

## 2024-03-28 NOTE — Progress Notes (Signed)
 Subjective: RR:Alec Gutierrez follow up PCP: Alec Alec HERO, DO YEP:[name] is a 63 y.o. male presenting to clinic today for:  Started on Zepbound  in September for morbid obesity associated with OSA.  Elected to stay on 2.5mg  for 2 months.  Now taking 7.5 mg.  Starting weight 211lb. Doing really well but states he doesn't feel like he has lost any substantial weight.  He denies any abdominal pain, nausea, vomiting, changes in bowel habits.  He is eating high-protein, high vegetable diet and drinking plenty of water.  He continues to consume at least 2 beers per night and 1 [facility] per week.  Not doing any structured physical activity.   ROS: Per HPI  No Known Allergies Past Medical History:  Diagnosis Date   Arthritis    GERD (gastroesophageal reflux disease)    History of BPH    Hyperlipidemia    Hypertension    Sleep apnea    uses CPAP     Current Outpatient Medications:    amLODipine  (NORVASC ) 5 MG tablet, Take 1 tablet (5 mg total) by mouth daily., Disp: 90 tablet, Rfl: 3   atorvastatin  (LIPITOR) 40 MG tablet, Take 1 tablet (40 mg total) by mouth daily., Disp: 90 tablet, Rfl: 3   buPROPion  (WELLBUTRIN  XL) 150 MG 24 hr tablet, Take 3 tablets (450 mg total) by mouth daily., Disp: 270 tablet, Rfl: 3   hydrochlorothiazide  (HYDRODIURIL ) 25 MG tablet, Take 1 tablet (25 mg total) by mouth daily., Disp: 90 tablet, Rfl: 3   meloxicam  (MOBIC ) 15 MG tablet, Take 1 tablet (15 mg total) by mouth daily as needed. for pain, Disp: 90 tablet, Rfl: 1   methocarbamol  (ROBAXIN ) 500 MG tablet, Take 1 tablet (500 mg total) by mouth every 8 (eight) hours as needed for muscle spasms. (Patient not taking: Reported on 12/31/2023), Disp: 30 tablet, Rfl: 1   omeprazole  (PRILOSEC) 40 MG capsule, Take 1 capsule (40 mg total) by mouth daily., Disp: 90 capsule, Rfl: 3   tirzepatide  (ZEPBOUND ) 10 MG/0.5ML injection vial, Inject 10 mg into the skin every 7 (seven) days., Disp: 2 mL, Rfl: 0 Social  History   Socioeconomic History   Marital status: Divorced    Spouse name: Not on file   Number of children: Not on file   Years of education: Not on file   Highest education level: Not on file  Occupational History   Not on file  Tobacco Use   Smoking status: Former    Current packs/day: 0.00    Types: Cigarettes    Quit date: 05/15/2015    Years since quitting: 8.8   Smokeless tobacco: Never  Vaping Use   Vaping status: Never Used  Substance and Sexual Activity   Alcohol use: Yes    Alcohol/week: 49.0 standard drinks of alcohol    Types: 49 Cans of beer per week   Drug use: No   Sexual activity: Not on file  Other Topics Concern   Not on file  Social History Narrative   Not on file   Social Drivers of Health   Financial Resource Strain: Not on file  Food Insecurity: No Food Insecurity (12/31/2023)   Hunger Vital Sign    Worried About Running Out of Food in the Last Year: Never true    Ran Out of Food in the Last Year: Never true  Transportation Needs: No Transportation Needs (12/31/2023)   PRAPARE - Transportation    Lack of Transportation (Medical): No    Lack  of Transportation (Non-Medical): No  Physical Activity: Not on file  Stress: Not on file  Social Connections: Not on file  Intimate Partner Violence: Not At Risk (12/31/2023)   Humiliation, Afraid, Rape, and Kick questionnaire    Fear of Current or Ex-Partner: No    Emotionally Abused: No    Physically Abused: No    Sexually Abused: No   Family History  Problem Relation Age of Onset   Cancer Mother 56       unsure pt thinks it was colon cancer   Colon cancer Mother    Heart disease Father    Stroke Father    Esophageal cancer Neg Hx    Rectal cancer Neg Hx    Stomach cancer Neg Hx    Colon polyps Neg Hx     Objective: Office vital signs reviewed. BP 127/74   Pulse 83   Temp 97.9 F (36.6 C)   Ht 5' 8 (1.727 m)   Wt 191 lb 6 oz (86.8 kg)   SpO2 99%   BMI 29.10 kg/m   Physical  Examination:  General: Awake, alert, well-appearing male, No acute distress HEENT: sclera white, MMM Cardio: regular rate and rhythm, S1S2 heard, no murmurs appreciated Pulm: clear to auscultation bilaterally, no wheezes, rhonchi or rales; normal work of breathing on room air GI: Protuberant, soft, non-tender, non-distended, bowel sounds present x4, no hepatomegaly appreciated, no splenomegaly, no masses    Assessment/ Plan: 63 y.o. male   Overweight (BMI 25.0-29.9) - Plan: Tirzepatide -Weight Management (ZEPBOUND ) 12.5 MG/0.5ML SOLN, Tirzepatide -Weight Management (ZEPBOUND ) 15 MG/0.5ML SOLN  Severe obstructive sleep apnea - Plan: Tirzepatide -Weight Management (ZEPBOUND ) 12.5 MG/0.5ML SOLN, Tirzepatide -Weight Management (ZEPBOUND ) 15 MG/0.5ML SOLN  Weight loss counseling, encounter for - Plan: Tirzepatide -Weight Management (ZEPBOUND ) 12.5 MG/0.5ML SOLN, Tirzepatide -Weight Management (ZEPBOUND ) 15 MG/0.5ML SOLN   Has reduced his weight from obese category 2 overweight category.  Will continue to advance his Zepbound  as tolerated and future orders have been sent to mail order pharmacy.  We discussed increasing physical activity, continued high-protein, high-fiber diet with plenty of water.  Advised to cut back on alcohol as these are empty calories.  Will see him back in 4 to 6 months, sooner if concerns arise   Alec CHRISTELLA Fielding, DO [facility] [phone]

## 2024-04-06 ENCOUNTER — Other Ambulatory Visit: Payer: Self-pay | Admitting: Family Medicine

## 2024-04-06 DIAGNOSIS — E6609 Other obesity due to excess calories: Secondary | ICD-10-CM

## 2024-04-06 DIAGNOSIS — G4733 Obstructive sleep apnea (adult) (pediatric): Secondary | ICD-10-CM

## 2024-05-09 ENCOUNTER — Ambulatory Visit: Admitting: Nurse Practitioner

## 2024-05-09 ENCOUNTER — Encounter: Payer: Self-pay | Admitting: Nurse Practitioner

## 2024-05-09 VITALS — BP 106/71 | HR 111 | Temp 97.6°F | Ht 68.0 in | Wt 177.0 lb

## 2024-05-09 DIAGNOSIS — M546 Pain in thoracic spine: Secondary | ICD-10-CM | POA: Diagnosis not present

## 2024-05-09 MED ORDER — PREDNISONE 20 MG PO TABS: 40.0000 mg | ORAL_TABLET | Freq: Every day | ORAL | 0 refills | Status: AC

## 2024-05-09 NOTE — Progress Notes (Signed)
 "  Subjective:    Patient ID: Alec Gutierrez, male    DOB: 1960-09-24, 64 y.o.   MRN: [id number]   Chief Complaint: Back Pain (Upper back. Not hurting today)   Back Pain This is a new problem. The current episode started 1 to 4 weeks ago. The problem occurs intermittently. The problem has been waxing and waning since onset. The pain is present in the thoracic spine. The quality of the pain is described as aching and cramping. The pain does not radiate. The pain is at a severity of 5/10. The pain is moderate. The pain is The same all the time. The symptoms are aggravated by sitting. Pertinent negatives include no chest pain, headaches, numbness, paresis, paresthesias or weight loss. He has tried analgesics and muscle relaxant for the symptoms. The treatment provided mild relief.   Not aware of ay injury. Says he is really not hurting today.  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  Respiratory:  Negative for shortness of breath.   Cardiovascular:  Negative for chest pain.  Musculoskeletal:  Positive for back pain.  Neurological:  Negative for  numbness, headaches and paresthesias.       Objective:   Physical Exam Constitutional:      Appearance: Normal appearance. He is obese.  Cardiovascular:     Rate and Rhythm: Normal rate and regular rhythm.     Heart sounds: Normal heart sounds.  Pulmonary:     Effort: Pulmonary effort is normal.     Breath sounds: Normal breath sounds.  Musculoskeletal:     Comments: FROM of thoracic spine with no pain No point tenderenss  Skin:    General: Skin is warm.  Neurological:     General: No focal deficit present.     Mental Status: He is alert and oriented to person, place, and time.  Psychiatric:        Mood and Affect: Mood normal.        Behavior: Behavior normal.    BP 106/71   Pulse (!) 111   Temp 97.6 F (36.4 C) (Temporal)   Ht 5' 8 (1.727 m)   Wt 177 lb (80.3 kg)   SpO2 98%   BMI 26.91 kg/m         Assessment & Plan:   Alec Gutierrez in today with chief complaint of Back Pain (Upper back. Not hurting today)   1. Acute bilateral thoracic back pain (Primary) Moist heat  Rest RTO prn - DG Chest 2 View - predniSONE  (DELTASONE ) 20 MG tablet; Take 2 tablets (40 mg total) by  mouth daily with breakfast for 5 days. 2 po daily for 5 days  Dispense: 10 tablet; Refill: 0    The above assessment and management plan was discussed with the patient. The patient verbalized understanding of and has agreed to the management plan. Patient is aware to call the clinic if symptoms persist or worsen. Patient is aware when to return to the clinic for a follow-up visit. Patient educated on when it is appropriate to go to the emergency department.   Mary-Margaret Gladis, FNP   "

## 2024-05-09 NOTE — Patient Instructions (Signed)
Scapular Winging Rehab Ask your health care provider which exercises are safe for you. Do exercises exactly as told by your provider and adjust them as directed. It is normal to feel mild stretching, pulling, tightness, or discomfort as you do these exercises. Stop right away if you feel sudden pain or your pain gets worse. Do not begin these exercises until told by your provider. Strengthening exercises These exercises build strength and endurance in your shoulder. Endurance is the ability to use your muscles for a long time, even after they get tired. Scapular depression and retraction After you have practiced this exercise, try doing it without the arm support. Then, try doing it while standing instead of sitting. Sit on a stable chair. Support your arms in front of you with pillows, armrests, or a tabletop. Keep your elbows near the sides of your body. Gently move your shoulder blades down (scapular depression) and back toward your spine (retraction). Relax the muscles on the tops of your shoulders and in the back of your neck. Hold for __________ seconds. Slowly release the tension, and relax your muscles completely before you repeat the exercise. Repeat __________ times. Complete this exercise __________ times a day. Scapular protraction, standing  Stand so you are facing a wall, about one arm-length away from the wall. Place your hands on the wall and straighten your elbows. Move your shoulder blades down, toward the middle of your spine. Keep your shoulder blades down and move them forward, toward the wall (protraction). You should feel your shoulder blades sliding forward, around your rib cage. If you are not sure that you are doing this exercise correctly, ask your provider for more instructions. Hold for __________ seconds. Slowly return to the starting position. Let your muscles relax completely before you repeat this exercise. Repeat __________ times. Complete this exercise  __________ times a day. Scapular protraction, supine  Lie on your back on a firm surface (supine position). Hold a __________ weight in your left / right hand. Raise your left / right arm straight into the air so your hand is directly above your shoulder joint. Push the weight into the air so your shoulder blade lifts off the surface that you are lying on. The scapula will push forward (protraction). Do not move your head, neck, or back. Hold for __________ seconds. Slowly return to the starting position. Let your muscles relax completely before you repeat this exercise. Repeat __________ times. Complete this exercise __________ times a day. Scapular protraction, quadruped  Get on your hands and knees (quadruped position). Your hands should be directly below your shoulder blades (scapulae). Straighten your arms until your elbows are locked. Round your back as much as you can. Think about lifting your rib cage up into your shoulder blades. The scapula will push downward or forward (protraction). Keep your neck muscles relaxed. Hold for __________ seconds. Slowly return to the starting position. Let your muscles relax completely before you repeat this exercise. Repeat __________ times. Complete this exercise __________ times a day. Scapular depression  Sit in a stable chair that has armrests. Sit upright, with your feet flat on the floor. Put your hands on the armrests with your elbows bent and your fingers pointing forward. Your hands should be about even with the sides of your body. Push down on the armrests to lift yourself off the chair. Straighten your elbows and lift yourself up as much as you comfortably can. Move your shoulder blades down and back (scapular depression). Do not let your  shoulders move up toward your ears. Keep your feet on the ground. As you get stronger, your feet should support less of your body weight as you do this exercise. Hold for __________ seconds. Slowly  lower yourself back into the chair. Repeat __________ times. Complete this exercise __________ times a day. Shoulder extension, prone  Lie on your abdomen on a firm surface (prone position) so your left / right arm hangs over the edge. Hold a __________ weight in your left / right hand so your palm faces in toward your body. Your arm should be straight. Squeeze your shoulder blade down toward the middle of your back. Slowly raise your arm behind you and toward the ceiling, up to the height of the surface that you are lying on (extension). Keep your arm straight. Hold for __________ seconds. Slowly return to the starting position and relax your muscles. Repeat __________ times. Complete this exercise __________ times a day. Horizontal abduction, prone  Lie on your abdomen on a firm surface (prone position) so your left / right arm hangs over the edge. Hold a __________ weight in your hand so your palm faces toward your feet. Your arm should be straight. Squeeze your shoulder blade down toward the middle of your back. Raise your left/right arm up to the height of the surface that you are lying on. Keep your arm straight and point your thumb forward. At the top of the movement, your palm should face the floor. Hold for __________ seconds. Slowly return to the starting position and relax your muscles. Repeat __________ times. Complete this exercise __________ times a day. Scapular retraction  Sit in a stable chair without armrests, or stand. Secure an exercise band to a stable object in front of you so it is at shoulder height. Hold one end of the exercise band in each hand. Your palms should face each other. Straighten your elbows and lift your arms up to shoulder height. Step back, away from the secured end of the exercise band, until the band stretches. Squeeze your shoulder blades together (scapular retraction) and move your elbows back behind you. Do not shrug your shoulders while you  do this. Your elbows should stay at about chest or shoulder height. Keep your upper arms lifted, away from your sides. Hold for __________ seconds. Slowly return to the starting position. Repeat __________ times. Complete this exercise __________ times a day. Shoulder extension  Sit in a stable chair without armrests, or stand. Secure an exercise band to a stable object in front of you so it is at shoulder height. Hold one end of the exercise band in each hand. Your palms should face down. Straighten your elbows and lift your hands up to shoulder height. Step back, away from the secured end of the exercise band, until the band stretches. Squeeze your shoulder blades together and pull your hands down to the sides of your thighs (extension). Stop when your hands are straight down by your sides. Do not let your hands go behind your body. Hold for __________ seconds. Slowly return to the starting position. Repeat __________ times. Complete this exercise __________ times a day. Scapular retraction and external rotation  Sit in a stable chair without armrests, or stand. Secure an exercise band to a stable object in front of you so it is at shoulder height. Hold one end of the exercise band in each hand. Your palms should face down. Straighten your elbows and lift your hands up to shoulder height. Step back, away  from the secured end of the exercise band, until the band stretches. Bend your elbows and raise your hands up to the height of your head. This is external rotation. Your palms should face out, in front of you, at the top of the movement. Squeeze your shoulder blades together during this movement. This is scapular retraction. Hold for __________ seconds. Slowly straighten your arms to return to the starting position. Repeat __________ times. Complete this exercise __________ times a day. This information is not intended to replace advice given to you by your health care provider. Make  sure you discuss any questions you have with your health care provider. Document Revised: 11/12/2021 Document Reviewed: 11/12/2021 [employer] Patient Education  2024 [employer].

## 2024-08-03 ENCOUNTER — Encounter: Payer: Self-pay | Admitting: Family Medicine
